# Patient Record
Sex: Female | Born: 1995 | Race: Black or African American | Hispanic: No | Marital: Single | Smoking: Never smoker
Health system: Southern US, Community
[De-identification: ages and names within clinical notes are randomized; demographics above are authoritative.]

## PROBLEM LIST (undated history)

## (undated) ENCOUNTER — Inpatient Hospital Stay: Payer: Self-pay

## (undated) DIAGNOSIS — F32A Depression, unspecified: Secondary | ICD-10-CM

## (undated) DIAGNOSIS — M329 Systemic lupus erythematosus, unspecified: Secondary | ICD-10-CM

## (undated) DIAGNOSIS — T7840XA Allergy, unspecified, initial encounter: Secondary | ICD-10-CM

## (undated) DIAGNOSIS — M3500 Sicca syndrome, unspecified: Secondary | ICD-10-CM

## (undated) DIAGNOSIS — N760 Acute vaginitis: Secondary | ICD-10-CM

## (undated) DIAGNOSIS — IMO0002 Reserved for concepts with insufficient information to code with codable children: Secondary | ICD-10-CM

## (undated) DIAGNOSIS — B9689 Other specified bacterial agents as the cause of diseases classified elsewhere: Secondary | ICD-10-CM

## (undated) DIAGNOSIS — R87629 Unspecified abnormal cytological findings in specimens from vagina: Secondary | ICD-10-CM

## (undated) HISTORY — DX: Sjogren syndrome, unspecified: M35.00

## (undated) HISTORY — DX: Allergy, unspecified, initial encounter: T78.40XA

## (undated) HISTORY — PX: THERAPEUTIC ABORTION: SHX798

## (undated) HISTORY — DX: Depression, unspecified: F32.A

## (undated) HISTORY — DX: Reserved for concepts with insufficient information to code with codable children: IMO0002

## (undated) HISTORY — DX: Systemic lupus erythematosus, unspecified: M32.9

---
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
## 2014-09-28 ENCOUNTER — Encounter (HOSPITAL_COMMUNITY): Payer: Self-pay | Admitting: Nurse Practitioner

## 2014-09-28 ENCOUNTER — Emergency Department (HOSPITAL_COMMUNITY)
Admission: EM | Admit: 2014-09-28 | Discharge: 2014-09-28 | Disposition: A | Discharge status: Home / Self Care | Payer: 59 | Attending: Emergency Medicine | Admitting: Emergency Medicine

## 2014-09-28 DIAGNOSIS — Z3202 Encounter for pregnancy test, result negative: Secondary | ICD-10-CM | POA: Diagnosis not present

## 2014-09-28 DIAGNOSIS — X58XXXA Exposure to other specified factors, initial encounter: Secondary | ICD-10-CM | POA: Diagnosis not present

## 2014-09-28 DIAGNOSIS — Y9302 Activity, running: Secondary | ICD-10-CM | POA: Insufficient documentation

## 2014-09-28 DIAGNOSIS — Z880 Allergy status to penicillin: Secondary | ICD-10-CM | POA: Diagnosis not present

## 2014-09-28 DIAGNOSIS — Y998 Other external cause status: Secondary | ICD-10-CM | POA: Diagnosis not present

## 2014-09-28 DIAGNOSIS — Y929 Unspecified place or not applicable: Secondary | ICD-10-CM | POA: Diagnosis not present

## 2014-09-28 DIAGNOSIS — S39012A Strain of muscle, fascia and tendon of lower back, initial encounter: Secondary | ICD-10-CM

## 2014-09-28 DIAGNOSIS — N939 Abnormal uterine and vaginal bleeding, unspecified: Secondary | ICD-10-CM | POA: Diagnosis present

## 2014-09-28 DIAGNOSIS — N921 Excessive and frequent menstruation with irregular cycle: Secondary | ICD-10-CM | POA: Insufficient documentation

## 2014-09-28 LAB — POC URINE PREG, ED: PREG TEST UR: NEGATIVE

## 2014-09-28 MED ORDER — IBUPROFEN 600 MG PO TABS: 600.0000 mg | ORAL_TABLET | Freq: Four times a day (QID) | ORAL | Status: DC | PRN

## 2014-09-28 NOTE — ED Provider Notes (Signed)
CSN: [id number]     Arrival date & time 09/28/14  1807 History   First MD Initiated Contact with Patient 09/28/14 1840     Chief Complaint  Patient presents with  . Leg Pain  . Vaginal Bleeding     (Consider location/radiation/quality/duration/timing/severity/associated sxs/prior Treatment) The history is provided by the patient.   patient presents with pain in her left lower back began while running. Radiates up the leg. It is dull. Is worse with movement. She is advancing as before. No numbness weakness. Feels as if she pulled a muscle. She also states she's had some vaginal spotting. States it is like menstrual blood. She states she is a week early for her menses. States she does not think she could be pregnant although she has had unprotected sex and is not using birth control. Has been on treatment for BV. Had recent STD check with the last couple weeks. Does not thinks that she needs STD check at this time. No other bleeding.   History reviewed. No pertinent past medical history. History reviewed. No pertinent past surgical history. History reviewed. No pertinent family history. History  Substance Use Topics  . Smoking status: Never Smoker   . Smokeless tobacco: Not on file  . Alcohol Use: No   OB History    No data available     Review of Systems  Constitutional: Negative for activity change and appetite change.  Eyes: Negative for pain.  Respiratory: Negative for chest tightness and shortness of breath.   Cardiovascular: Negative for chest pain and leg swelling.  Gastrointestinal: Negative for nausea, vomiting, abdominal pain and diarrhea.  Genitourinary: Positive for vaginal bleeding. Negative for flank pain.  Musculoskeletal: Positive for back pain. Negative for neck stiffness.  Skin: Negative for rash.  Neurological: Negative for weakness, numbness and headaches.  Psychiatric/Behavioral: Negative for behavioral problems.      Allergies  Penicillins  Home  Medications   Prior to Admission medications   Medication Sig Start Date End Date Taking? Authorizing Provider  ibuprofen (ADVIL,MOTRIN) 600 MG tablet Take 1 tablet (600 mg total) by mouth every 6 (six) hours as needed. 09/28/14   Benjiman CoreNathan Josph [place], MD   BP 108/73 mmHg  Pulse 70  Temp(Src) 98 F (36.7 C) (Oral)  Resp 16  Ht 5\' 3"  (1.6 m)  Wt 140 lb (63.504 kg)  BMI 24.81 kg/m2  SpO2 99%  LMP 09/04/2014 Physical Exam  Constitutional: She appears well-developed and well-nourished.  HENT:  Head: Normocephalic.  Eyes: EOM are normal.  Neck: Neck supple.  Cardiovascular: Normal rate.   Pulmonary/Chest: Effort normal.  Abdominal: Soft.  Musculoskeletal: She exhibits tenderness.  Some tenderness over SI joint on left. No pain with straight leg raise. Lumbar spine nontender.  Neurological: She is alert.  Skin: Skin is warm.    ED Course  Procedures (including critical care time) Labs Review Labs Reviewed  POC URINE PREG, ED    Imaging Review No results found.   EKG Interpretation None      MDM   Final diagnoses:  Lumbar strain, initial encounter  Metrorrhagia    Patient with back pain. 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 Also has slight vaginal bleeding but is not pregnant. Will discharge home.     Benjiman CoreNathan Dulcey Riederer, MD 09/29/14 [phone]

## 2014-09-28 NOTE — ED Notes (Signed)
Today she was running and felt a pulling sensation in her L buttock down L leg. She is also concerned because her menstrual cycle is 1 week early with some cramping.

## 2014-09-28 NOTE — Discharge Instructions (Signed)
Back Pain, Adult Low back pain is very common. About 1 in 5 people have back pain.The cause of low back pain is rarely dangerous. The pain often gets better over time.About half of people with a sudden onset of back pain feel better in just 2 weeks. About 8 in 10 people feel better by 6 weeks.  CAUSES Some common causes of back pain include:  Strain of the muscles or ligaments supporting the spine.  Wear and tear (degeneration) of the spinal discs.  Arthritis.  Direct injury to the back. DIAGNOSIS Most of the time, the direct cause of low back pain is not known.However, back pain can be treated effectively even when the exact cause of the pain is unknown.Answering your caregiver's questions about your overall health and symptoms is one of the most accurate ways to make sure the cause of your pain is not dangerous. If your caregiver needs more information, he or she may order lab work or imaging tests (X-rays or MRIs).However, even if imaging tests show changes in your back, this usually does not require surgery. HOME CARE INSTRUCTIONS For many people, back pain returns.Since low back pain is rarely dangerous, it is often a condition that people can learn to manageon their own.   Remain active. It is stressful on the back to sit or stand in one place. Do not sit, drive, or stand in one place for more than 30 minutes at a time. Take short walks on level surfaces as soon as pain allows.Try to increase the length of time you walk each day.  Do not stay in bed.Resting more than 1 or 2 days can delay your recovery.  Do not avoid exercise or work.Your body is made to move.It is not dangerous to be active, even though your back may hurt.Your back will likely heal faster if you return to being active before your pain is gone.  Pay attention to your body when you bend and lift. Many people have less discomfortwhen lifting if they bend their knees, keep the load close to their bodies,and  avoid twisting. Often, the most comfortable positions are those that put less stress on your recovering back.  Find a comfortable position to sleep. Use a firm mattress and lie on your side with your knees slightly bent. If you lie on your back, put a pillow under your knees.  Only take over-the-counter or prescription medicines as directed by your caregiver. Over-the-counter medicines to reduce pain and inflammation are often the most helpful.Your caregiver may prescribe muscle relaxant drugs.These medicines help dull your pain so you can more quickly return to your normal activities and healthy exercise.  Put ice on the injured area.  Put ice in a plastic bag.  Place a towel between your skin and the bag.  Leave the ice on for 15-20 minutes, 03-04 times a day for the first 2 to 3 days. After that, ice and heat may be alternated to reduce pain and spasms.  Ask your caregiver about trying back exercises and gentle massage. This may be of some benefit.  Avoid feeling anxious or stressed.Stress increases muscle tension and can worsen back pain.It is important to recognize when you are anxious or stressed and learn ways to manage it.Exercise is a great option. SEEK MEDICAL CARE IF:  You have pain that is not relieved with rest or medicine.  You have pain that does not improve in 1 week.  You have new symptoms.  You are generally not feeling well. SEEK   IMMEDIATE MEDICAL CARE IF:   You have pain that radiates from your back into your legs.  You develop new bowel or bladder control problems.  You have unusual weakness or numbness in your arms or legs.  You develop nausea or vomiting.  You develop abdominal pain.  You feel faint. Document Released: 05/02/2005 Document Revised: 11/01/2011 Document Reviewed: 09/03/2013 ExitCare Patient Information 2015 [employer]. This information is not intended to replace advice given to you by your health care provider. Make sure you  discuss any questions you have with your health care provider.  

## 2015-03-19 ENCOUNTER — Emergency Department (HOSPITAL_COMMUNITY)
Admission: EM | Admit: 2015-03-19 | Discharge: 2015-03-19 | Discharge status: Against Medical Advice | Payer: Medicaid Other | Attending: Emergency Medicine | Admitting: Emergency Medicine

## 2015-03-19 ENCOUNTER — Encounter (HOSPITAL_COMMUNITY): Payer: Self-pay | Admitting: *Deleted

## 2015-03-19 DIAGNOSIS — Z3A13 13 weeks gestation of pregnancy: Secondary | ICD-10-CM | POA: Insufficient documentation

## 2015-03-19 DIAGNOSIS — O9989 Other specified diseases and conditions complicating pregnancy, childbirth and the puerperium: Secondary | ICD-10-CM | POA: Diagnosis not present

## 2015-03-19 DIAGNOSIS — R1030 Lower abdominal pain, unspecified: Secondary | ICD-10-CM | POA: Insufficient documentation

## 2015-03-19 LAB — URINALYSIS, ROUTINE W REFLEX MICROSCOPIC
Bilirubin Urine: NEGATIVE
GLUCOSE, UA: NEGATIVE mg/dL
Hgb urine dipstick: NEGATIVE
Ketones, ur: 15 mg/dL — AB
LEUKOCYTES UA: NEGATIVE
Nitrite: NEGATIVE
PH: 6 (ref 5.0–8.0)
Protein, ur: NEGATIVE mg/dL
SPECIFIC GRAVITY, URINE: 1.031 — AB (ref 1.005–1.030)
Urobilinogen, UA: 0.2 mg/dL (ref 0.0–1.0)

## 2015-03-19 NOTE — ED Notes (Signed)
Pt called for room x3, no answer 

## 2015-03-19 NOTE — ED Notes (Signed)
Patient reports lower abdominal pain, constant in nature, since yesterday. Patient is [redacted] weeks pregnant. Reports no vaginal bleeding. States she just wants to make sure everything is okay.

## 2015-03-19 NOTE — ED Notes (Signed)
Pt called for room, no answer.

## 2015-04-20 ENCOUNTER — Other Ambulatory Visit: Payer: Self-pay | Admitting: Family Medicine

## 2015-04-20 DIAGNOSIS — Z3492 Encounter for supervision of normal pregnancy, unspecified, second trimester: Secondary | ICD-10-CM

## 2015-05-01 ENCOUNTER — Ambulatory Visit
Admission: RE | Admit: 2015-05-01 | Discharge: 2015-05-01 | Disposition: A | Discharge status: Home / Self Care | Payer: Medicaid Other | Source: Ambulatory Visit | Attending: Family Medicine | Admitting: Family Medicine

## 2015-05-01 DIAGNOSIS — Z36 Encounter for antenatal screening of mother: Secondary | ICD-10-CM | POA: Insufficient documentation

## 2015-05-01 DIAGNOSIS — Z3492 Encounter for supervision of normal pregnancy, unspecified, second trimester: Secondary | ICD-10-CM

## 2015-05-17 DIAGNOSIS — Z5189 Encounter for other specified aftercare: Secondary | ICD-10-CM

## 2015-05-17 HISTORY — DX: Encounter for other specified aftercare: Z51.89

## 2015-11-28 ENCOUNTER — Emergency Department
Admission: EM | Admit: 2015-11-28 | Discharge: 2015-11-29 | Disposition: A | Discharge status: Home / Self Care | Payer: Medicaid Other | Attending: Emergency Medicine | Admitting: Emergency Medicine

## 2015-11-28 ENCOUNTER — Emergency Department: Payer: Medicaid Other

## 2015-11-28 ENCOUNTER — Encounter: Payer: Self-pay | Admitting: Emergency Medicine

## 2015-11-28 DIAGNOSIS — Z3A17 17 weeks gestation of pregnancy: Secondary | ICD-10-CM | POA: Diagnosis not present

## 2015-11-28 DIAGNOSIS — O209 Hemorrhage in early pregnancy, unspecified: Secondary | ICD-10-CM | POA: Diagnosis present

## 2015-11-28 DIAGNOSIS — O4690 Antepartum hemorrhage, unspecified, unspecified trimester: Secondary | ICD-10-CM

## 2015-11-28 DIAGNOSIS — O4692 Antepartum hemorrhage, unspecified, second trimester: Secondary | ICD-10-CM

## 2015-11-28 DIAGNOSIS — O200 Threatened abortion: Secondary | ICD-10-CM | POA: Diagnosis not present

## 2015-11-28 LAB — CBC WITH DIFFERENTIAL/PLATELET
BASOS ABS: 0 10*3/uL (ref 0–0.1)
BASOS PCT: 0 %
Eosinophils Absolute: 0 10*3/uL (ref 0–0.7)
Eosinophils Relative: 0 %
HEMATOCRIT: 38 % (ref 35.0–47.0)
Hemoglobin: 13.2 g/dL (ref 12.0–16.0)
Lymphocytes Relative: 14 %
Lymphs Abs: 1 10*3/uL (ref 1.0–3.6)
MCH: 28.2 pg (ref 26.0–34.0)
MCHC: 34.7 g/dL (ref 32.0–36.0)
MCV: 81.2 fL (ref 80.0–100.0)
MONO ABS: 0.5 10*3/uL (ref 0.2–0.9)
Monocytes Relative: 7 %
NEUTROS ABS: 5.6 10*3/uL (ref 1.4–6.5)
NEUTROS PCT: 79 %
Platelets: 247 10*3/uL (ref 150–440)
RBC: 4.68 MIL/uL (ref 3.80–5.20)
RDW: 13.4 % (ref 11.5–14.5)
WBC: 7.1 10*3/uL (ref 3.6–11.0)

## 2015-11-28 LAB — COMPREHENSIVE METABOLIC PANEL
ALT: 11 U/L — ABNORMAL LOW (ref 14–54)
AST: 18 U/L (ref 15–41)
Albumin: 3.7 g/dL (ref 3.5–5.0)
Alkaline Phosphatase: 43 U/L (ref 38–126)
Anion gap: 6 (ref 5–15)
BILIRUBIN TOTAL: 0.3 mg/dL (ref 0.3–1.2)
BUN: 10 mg/dL (ref 6–20)
CHLORIDE: 107 mmol/L (ref 101–111)
CO2: 23 mmol/L (ref 22–32)
CREATININE: 0.64 mg/dL (ref 0.44–1.00)
Calcium: 8.8 mg/dL — ABNORMAL LOW (ref 8.9–10.3)
GFR calc Af Amer: >60 mL/min (ref >60)
GFR calc non Af Amer: >60 mL/min (ref >60)
GLUCOSE: 73 mg/dL (ref 65–99)
POTASSIUM: 3.6 mmol/L (ref 3.5–5.1)
Sodium: 136 mmol/L (ref 135–145)
TOTAL PROTEIN: 7.1 g/dL (ref 6.5–8.1)

## 2015-11-28 LAB — ABO/RH: ABO/RH(D): O POS

## 2015-11-28 LAB — URINALYSIS COMPLETE WITH MICROSCOPIC (ARMC ONLY)
BILIRUBIN URINE: NEGATIVE
Bacteria, UA: NONE SEEN
Glucose, UA: NEGATIVE mg/dL
Hgb urine dipstick: NEGATIVE
Leukocytes, UA: NEGATIVE
Nitrite: NEGATIVE
PH: 6 (ref 5.0–8.0)
Protein, ur: NEGATIVE mg/dL
RBC / HPF: NONE SEEN RBC/hpf (ref 0–5)
Specific Gravity, Urine: 1.018 (ref 1.005–1.030)

## 2015-11-28 LAB — HCG, QUANTITATIVE, PREGNANCY: hCG, Beta Chain, Quant, S: 42014 m[IU]/mL — ABNORMAL HIGH (ref <5)

## 2015-11-28 NOTE — ED Notes (Signed)
[redacted] weeks pregnant, episode of bleeding in toilet today. Abdominal cramping.

## 2015-11-28 NOTE — ED Notes (Signed)
MD at bedside. 

## 2015-11-28 NOTE — Discharge Instructions (Signed)
Vaginal Bleeding During Pregnancy, Second Trimester A small amount of bleeding (spotting) from the vagina is relatively common in pregnancy. It usually stops on its own. Various things can cause bleeding or spotting in pregnancy. Some bleeding may be related to the pregnancy, and some may not. Sometimes the bleeding is normal and is not a problem. However, bleeding can also be a sign of something serious. Be sure to tell your health care provider about any vaginal bleeding right away. Some possible causes of vaginal bleeding during the second trimester include:  Infection, inflammation, or growths on the cervix.   The placenta may be partially or completely covering the opening of the cervix inside the uterus (placenta previa).  The placenta may have separated from the uterus (abruption of the placenta).   You may be having early (preterm) labor.   The cervix may not be strong enough to keep a baby inside the uterus (cervical insufficiency).   Tiny cysts may have developed in the uterus instead of pregnancy tissue (molar pregnancy). HOME CARE INSTRUCTIONS  Watch your condition for any changes. The following actions may help to lessen any discomfort you are feeling:  Follow your health care provider's instructions for limiting your activity. If your health care provider orders bed rest, you may need to stay in bed and only get up to use the bathroom. However, your health care provider may allow you to continue light activity.  If needed, make plans for someone to help with your regular activities and responsibilities while you are on bed rest.  Keep track of the number of pads you use each day, how often you change pads, and how soaked (saturated) they are. Write this down.  Do not use tampons. Do not douche.  Do not have sexual intercourse or orgasms until approved by your health care provider.  If you pass any tissue from your vagina, save the tissue so you can show it to your  health care provider.  Only take over-the-counter or prescription medicines as directed by your health care provider.  Do not take aspirin because it can make you bleed.  Do not exercise or perform any strenuous activities or heavy lifting without your health care provider's permission.  Keep all follow-up appointments as directed by your health care provider. SEEK MEDICAL CARE IF:  You have any vaginal bleeding during any part of your pregnancy.  You have cramps or labor pains.  You have a fever, not controlled by medicine. SEEK IMMEDIATE MEDICAL CARE IF:   You have severe cramps in your back or belly (abdomen).  You have contractions.  You have chills.  You pass large clots or tissue from your vagina.  Your bleeding increases.  You feel light-headed or weak, or you have fainting episodes.  You are leaking fluid or have a gush of fluid from your vagina. MAKE SURE YOU:  Understand these instructions.  Will watch your condition.  Will get help right away if you are not doing well or get worse.   This information is not intended to replace advice given to you by your health care provider. Make sure you discuss any questions you have with your health care provider.   Document Released: 02/09/2005 Document Revised: 05/07/2013 Document Reviewed: 01/07/2013 Elsevier Interactive Patient Education 2016 [employer]  Threatened Miscarriage A threatened miscarriage occurs when you have vaginal bleeding during your first 20 weeks of pregnancy but the pregnancy has not ended. If you have vaginal bleeding during this time, your health care  provider will do tests to make sure you are still pregnant. If the tests show you are still pregnant and the developing baby (fetus) inside your womb (uterus) is still growing, your condition is considered a threatened miscarriage. A threatened miscarriage does not mean your pregnancy will end, but it does increase the risk of losing your  pregnancy (complete miscarriage). CAUSES  The cause of a threatened miscarriage is usually not known. If you go on to have a complete miscarriage, the most common cause is an abnormal number of chromosomes in the developing baby. Chromosomes are the structures inside cells that hold all your genetic material. Some causes of vaginal bleeding that do not result in miscarriage include:  Having sex.  Having an infection.  Normal hormone changes of pregnancy.  Bleeding that occurs when an egg implants in your uterus. RISK FACTORS Risk factors for bleeding in early pregnancy include:  Obesity.  Smoking.  Drinking excessive amounts of alcohol or caffeine.  Recreational drug use. SIGNS AND SYMPTOMS  Light vaginal bleeding.  Mild abdominal pain or cramps. DIAGNOSIS  If you have bleeding with or without abdominal pain before 20 weeks of pregnancy, your health care provider will do tests to check whether you are still pregnant. One important test involves using sound waves and a computer (ultrasound) to create images of the inside of your uterus. Other tests include an internal exam of your vagina and uterus (pelvic exam) and measurement of your baby's heart rate.  You may be diagnosed with a threatened miscarriage if:  Ultrasound testing shows you are still pregnant.  Your baby's heart rate is strong.  A pelvic exam shows that the opening between your uterus and your vagina (cervix) is closed.  Your heart rate and blood pressure are stable.  Blood tests confirm you are still pregnant. TREATMENT  No treatments have been shown to prevent a threatened miscarriage from going on to a complete miscarriage. However, the right home care is important.  HOME CARE INSTRUCTIONS   Make sure you keep all your appointments for prenatal care. This is very important.  Get plenty of rest.  Do not have sex or use tampons if you have vaginal bleeding.  Do not douche.  Do not smoke or use  recreational drugs.  Do not drink alcohol.  Avoid caffeine. SEEK MEDICAL CARE IF:  You have light vaginal bleeding or spotting while pregnant.  You have abdominal pain or cramping.  You have a fever. SEEK IMMEDIATE MEDICAL CARE IF:  You have heavy vaginal bleeding.  You have blood clots coming from your vagina.  You have severe low back pain or abdominal cramps.  You have fever, chills, and severe abdominal pain. MAKE SURE YOU:  Understand these instructions.  Will watch your condition.  Will get help right away if you are not doing well or get worse.   This information is not intended to replace advice given to you by your health care provider. Make sure you discuss any questions you have with your health care provider.   Document Released: 05/02/2005 Document Revised: 05/07/2013 Document Reviewed: 02/26/2013 Elsevier Interactive Patient Education [employer]

## 2015-11-28 NOTE — ED Provider Notes (Signed)
[facility] Emergency Department Provider Note  ____________________________________________  Time seen: 11:30 PM  I have reviewed the triage vital signs and the nursing notes.   HISTORY  Chief Complaint Vaginal Bleeding     HPI Yvonne Christensen is a 20 y.o. female G2 para 01 previous elective abortion presents with history of being proximal to [redacted] weeks pregnant followed by [facility]. Patient admits to acute onset of abdominal cramping and one episode of vaginal bleeding which was noted today the patient states that last sexual intercourse was approximately one to 2 weeks ago. Patient states her blood type is O+.   Past medical history None There are no active problems to display for this patient.   Past surgical history  1 elective abortion.  Current Outpatient Rx  Name  Route  Sig  Dispense  Refill  . ibuprofen (ADVIL,MOTRIN) 600 MG tablet   Oral   Take 1 tablet (600 mg total) by mouth every 6 (six) hours as needed.   10 tablet   0     Allergies Penicillins  No family history on file.  Social History Social History  Substance Use Topics  . Smoking status: Never Smoker   . Smokeless tobacco: None  . Alcohol Use: No    Review of Systems  Constitutional: Negative for fever. Eyes: Negative for visual changes. ENT: Negative for sore throat. Cardiovascular: Negative for chest pain. Respiratory: Negative for shortness of breath. Gastrointestinal: Negative for abdominal pain, vomiting and diarrhea. Genitourinary: Negative for dysuria.Positive vaginal bleeding Musculoskeletal: Negative for back pain. Skin: Negative for rash. Neurological: Negative for headaches, focal weakness or numbness.   10-point ROS otherwise negative.  ____________________________________________   PHYSICAL EXAM:  VITAL SIGNS: ED Triage Vitals  Enc Vitals Group     BP 11/28/15 1718 104/60 mmHg     Pulse Rate 11/28/15 1718 81     Resp 11/28/15 1718 18      Temp 11/28/15 1718 98.3 F (36.8 C)     Temp Source 11/28/15 1718 Oral     SpO2 11/28/15 1718 100 %     Weight 11/28/15 1718 137 lb (62.143 kg)     Height 11/28/15 1718  (1.6 m)     Head Cir --      Peak Flow --      Pain Score 11/28/15 1720 5     Pain Loc --      Pain Edu? --      Excl. in GC? --      Constitutional: Alert and oriented. Well appearing and in no distress. Eyes: Conjunctivae are normal. PERRL. Normal extraocular movements. ENT   Head: Normocephalic and atraumatic.   Nose: No congestion/rhinnorhea.   Mouth/Throat: Mucous membranes are moist.   Neck: No stridor. Hematological/Lymphatic/Immunilogical: No cervical lymphadenopathy. Cardiovascular: Normal rate, regular rhythm. Normal and symmetric distal pulses are present in all extremities. No murmurs, rubs, or gallops. Respiratory: Normal respiratory effort without tachypnea nor retractions. Breath sounds are clear and equal bilaterally. No wheezes/rales/rhonchi. Gastrointestinal: Soft and nontender. No distention. There is no CVA tenderness. Genitourinary: No active vaginal bleeding noted on exam. Musculoskeletal: Nontender with normal range of motion in all extremities. No joint effusions.  No lower extremity tenderness nor edema. Neurologic:  Normal speech and language. No gross focal neurologic deficits are appreciated. Speech is normal.  Skin:  Skin is warm, dry and intact. No rash noted. Psychiatric: Mood and affect are normal. Speech and behavior are normal. Patient exhibits appropriate insight and  judgment.  ____________________________________________    LABS (pertinent positives/negatives)  Labs Reviewed  HCG, QUANTITATIVE, PREGNANCY - Abnormal; Notable for the following:    hCG, Beta Chain, Quant, S 42014 (*)    All other components within normal limits  COMPREHENSIVE METABOLIC PANEL - Abnormal; Notable for the following:    Calcium 8.8 (*)    ALT 11 (*)    All other components  within normal limits  URINALYSIS COMPLETEWITH MICROSCOPIC ([facility] ONLY) - Abnormal; Notable for the following:    Color, Urine YELLOW (*)    APPearance CLEAR (*)    Ketones, ur 1+ (*)    Squamous Epithelial / LPF 0-5 (*)    All other components within normal limits  CBC WITH DIFFERENTIAL/PLATELET  ABO/RH      RADIOLOGY  US OB Limited (Final result) Result time: 11/28/15 21:17:17   Procedure changed from US Ob Comp + 14 Wk      Final result by Rad Results In Interface (11/28/15 21:17:17)   Narrative:   CLINICAL DATA: Vaginal bleeding this afternoon. Estimated gestational age by LMP is 16 weeks 6 days.  EXAM: LIMITED OBSTETRIC ULTRASOUND  FINDINGS: Number of Fetuses: Single  Heart Rate: 141 bpm  Movement: Fetal movement is observed.  Presentation: Cephalic presentation.  Placental Location: Anterior  Previa: None.  Amniotic Fluid (Subjective): Within normal limits.  BPD: 3.9cm 17w 5d  MATERNAL FINDINGS:  Cervix: Cervix is closed. Cervical length measures 3.3 cm.  Uterus/Adnexae: Limited visualization. Ovaries not visualized.  IMPRESSION: Single intrauterine pregnancy with fetus in cephalic presentation. No acute complications demonstrated on limited imaging.  This exam is performed on an emergent basis and does not comprehensively evaluate fetal size, dating, or anatomy; follow-up complete OB US should be considered if further fetal assessment is warranted.   Electronically Signed By: Burman NievesWilliam Stevens M.D. On: 11/28/2015 21:17     Procedures      INITIAL IMPRESSION / ASSESSMENT AND PLAN / ED COURSE  Pertinent labs & imaging results that were available during my care of the patient were reviewed by me and considered in my medical decision making (see chart for details).    ____________________________________________   FINAL CLINICAL IMPRESSION(S) / ED DIAGNOSES  Final diagnoses:  Vaginal bleeding in pregnancy       Darci Currentandolph N Wendy Hoback, MD 11/29/15 [phone]

## 2015-11-29 LAB — CHLAMYDIA/NGC RT PCR (ARMC ONLY)
CHLAMYDIA TR: DETECTED — AB
N gonorrhoeae: NOT DETECTED

## 2015-11-29 LAB — WET PREP, GENITAL
Clue Cells Wet Prep HPF POC: NONE SEEN
Sperm: NONE SEEN
Trich, Wet Prep: NONE SEEN
YEAST WET PREP: NONE SEEN

## 2015-11-30 ENCOUNTER — Telehealth: Payer: Self-pay | Admitting: Emergency Medicine

## 2015-11-30 NOTE — Telephone Encounter (Signed)
Called patient and informed her of positive chlamydia test and need for treatment.  Per dr Cyril Loosenkinner call in azithromycin 1 gram po x 1 dose to pt preferred pharmacy.  Called to [facility] [place].  She has arranged follow up with [facility] on Wednesday.

## 2016-01-01 DIAGNOSIS — Z8619 Personal history of other infectious and parasitic diseases: Secondary | ICD-10-CM | POA: Insufficient documentation

## 2016-03-16 DIAGNOSIS — B9689 Other specified bacterial agents as the cause of diseases classified elsewhere: Secondary | ICD-10-CM

## 2016-03-16 HISTORY — DX: Other specified bacterial agents as the cause of diseases classified elsewhere: B96.89

## 2016-03-25 ENCOUNTER — Observation Stay
Admission: EM | Admit: 2016-03-25 | Discharge: 2016-03-25 | Disposition: A | Discharge status: Home / Self Care | Payer: Medicaid Other | Attending: Obstetrics & Gynecology | Admitting: Obstetrics & Gynecology

## 2016-03-25 ENCOUNTER — Encounter: Payer: Self-pay | Admitting: *Deleted

## 2016-03-25 DIAGNOSIS — R32 Unspecified urinary incontinence: Secondary | ICD-10-CM | POA: Diagnosis not present

## 2016-03-25 DIAGNOSIS — O26893 Other specified pregnancy related conditions, third trimester: Secondary | ICD-10-CM | POA: Diagnosis not present

## 2016-03-25 DIAGNOSIS — O42913 Preterm premature rupture of membranes, unspecified as to length of time between rupture and onset of labor, third trimester: Secondary | ICD-10-CM | POA: Diagnosis present

## 2016-03-25 DIAGNOSIS — O4290 Premature rupture of membranes, unspecified as to length of time between rupture and onset of labor, unspecified weeks of gestation: Secondary | ICD-10-CM | POA: Diagnosis present

## 2016-03-25 DIAGNOSIS — Z3A33 33 weeks gestation of pregnancy: Secondary | ICD-10-CM | POA: Insufficient documentation

## 2016-03-25 HISTORY — DX: Other specified bacterial agents as the cause of diseases classified elsewhere: B96.89

## 2016-03-25 HISTORY — DX: Premature rupture of membranes, unspecified as to length of time between rupture and onset of labor, unspecified weeks of gestation: O42.90

## 2016-03-25 HISTORY — DX: Acute vaginitis: N76.0

## 2016-03-25 MED ORDER — ACETAMINOPHEN 325 MG PO TABS: 650.0000 mg | ORAL_TABLET | ORAL | Status: DC | PRN

## 2016-03-25 MED ORDER — ONDANSETRON HCL 4 MG/2ML IJ SOLN: 4.0000 mg | Freq: Four times a day (QID) | INTRAMUSCULAR | Status: DC | PRN

## 2016-03-25 NOTE — Discharge Summary (Signed)
Physician Discharge Summary  Patient ID: [name] MRN: [id number] DOB/AGE: March 30, 1996 20 y.o.  Admit date: 03/25/2016 Discharge date: 03/25/2016  Admission Diagnoses: Leakage of vaginal fluid  Discharge Diagnoses: Urinary Incontinence  Discharged Condition: good  Hospital Course: Seen and examined, see H&P.  No s/sx PPTOM or PTL.  [facility]  Consults: None  Significant Diagnostic Studies: A NST procedure was performed with FHR monitoring and a normal baseline established, appropriate time of 20-40 minutes of evaluation, and accels >2 seen w 15x15 characteristics.  Results show a REACTIVE NST.   Treatments: None  Discharge Exam: Height (P) 5\' 3"  (1.6 m), weight (P) 170 lb (77.1 kg), last menstrual period 08/02/2015. General appearance: alert, cooperative and no distress NST R; Rare ctx  Disposition: 01-Home or Self Care     Medication List    TAKE these medications   ibuprofen 600 MG tablet Commonly known as:  ADVIL,MOTRIN Take 1 tablet (600 mg total) by mouth every 6 (six) hours as needed.   multivitamin tablet Take 1 tablet by mouth daily. Prenatal vitamin        Signed: Letitia LibraRobert Paul Harris 03/25/2016, 12:09 PM

## 2016-03-25 NOTE — Discharge Instructions (Signed)
[facility]

## 2016-03-25 NOTE — H&P (Signed)
Obstetrics Admission History & Physical   CC:  vag fluid loss x2 with sneezes- reports odorless, clear small amount fluid   HPI:  20 y.o. G2P0010 @ 5821w5d (05/08/2016, Date entered prior to episode creation). Admitted on 03/25/2016:   Presents for leakage of vaginal fluid over last two days intermittantly, worse w sneeze or cough, no ctxs, no pain, no VB.  Prenatal care at: at another place  PMHx:  Past Medical History:  Diagnosis Date  . Bacterial vaginosis 03/2016   being treated, dosnot remember med name   PSHx:  Past Surgical History:  Procedure Laterality Date  . WISDOM TOOTH EXTRACTION  2013   g/a   Medications:  Prescriptions Prior to Admission  Medication Sig Dispense Refill Last Dose  . Multiple Vitamin (MULTIVITAMIN) tablet Take 1 tablet by mouth daily. Prenatal vitamin   0730  . ibuprofen (ADVIL,MOTRIN) 600 MG tablet Take 1 tablet (600 mg total) by mouth every 6 (six) hours as needed. (Patient not taking: Reported on 03/25/2016) 10 tablet 0 Not Taking at Unknown time   Allergies: is allergic to penicillins. OBHx:  OB History  Gravida Para Term Preterm AB Living  2       1    SAB TAB Ectopic Multiple Live Births               # Outcome Date GA Lbr Len/2nd Weight Sex Delivery Anes PTL Lv  2 Current           1 AB              AVW:[id number]/[id number]:Negative/unremarkable except as detailed in HPI. Soc Hx: Never smoker, Alcohol: none and Recreational drug use: none  Objective:  There were no vitals filed for this visit. General: Well nourished, well developed female in no acute distress.  Skin: Warm and dry.  Cardiovascular:Regular rate and rhythm. Respiratory: Clear to auscultation bilateral. Normal respiratory effort Abdomen: mild Neuro/Psych: Normal mood and affect.   Pelvic exam: is not limited by body habitus EGBUS: within normal limits Vagina: within normal limits and with normal mucosa blood in the vault Cervix: cl/th Uterus: Uterus demonstrates irritability  pattern.  Adnexa: not evaluated  EFM:FHR: 150 bpm, variability: minimal ,  accelerations:  Present,  decelerations:  Absent Toco: Frequency: Every 10 minutes  Fern Neg, Nitrazine ambiguous, Pooling neg  Assessment & Plan:   20 y.o. G2P0010 @ 921w5d, Admitted on 03/25/2016: Concern over vaginal leakage of fluid.  Urinary Incontinence. No signs of PPROM or PTL. Counseled likely Urinary Incontionence.

## 2016-04-21 ENCOUNTER — Observation Stay
Admission: EM | Admit: 2016-04-21 | Discharge: 2016-04-21 | Disposition: A | Discharge status: Home / Self Care | Payer: Medicaid Other | Attending: Obstetrics & Gynecology | Admitting: Obstetrics & Gynecology

## 2016-04-21 DIAGNOSIS — R109 Unspecified abdominal pain: Secondary | ICD-10-CM | POA: Insufficient documentation

## 2016-04-21 DIAGNOSIS — M545 Low back pain: Secondary | ICD-10-CM | POA: Diagnosis not present

## 2016-04-21 DIAGNOSIS — O368130 Decreased fetal movements, third trimester, not applicable or unspecified: Secondary | ICD-10-CM | POA: Diagnosis not present

## 2016-04-21 DIAGNOSIS — O26893 Other specified pregnancy related conditions, third trimester: Secondary | ICD-10-CM | POA: Diagnosis not present

## 2016-04-21 DIAGNOSIS — Z3A37 37 weeks gestation of pregnancy: Secondary | ICD-10-CM | POA: Diagnosis not present

## 2016-04-21 NOTE — Discharge Summary (Signed)
Yvonne Christensen is a 20 y.o. female. She is at 316w4d gestation.  Chief Complaint: abdominal pain, low back pain  S: Resting comfortably. no CTX, no VB.no LOF,  Active fetal movement.   Location: low back, upper abdomen Context: c/o abdominal pain and lower back pain- started yesterday at approx. 5 PM, intermittent. Denies bleeding, LOF, reports decreased fetal movement. Also reports that her hands feel numb occasionally. Last office visit 1 week ago, [facility], ([facility]), lives in [place] Quality: crampy Severity: mild Aggravating factors: none Alleviating factors: none Associated signs/symptoms: denies nausea, vomiting, diarrhea   Maternal Medical History:   Past Medical History:  Diagnosis Date  . Bacterial vaginosis 03/2016   being treated, dosnot remember med name    Past Surgical History:  Procedure Laterality Date  . NO PAST SURGERIES    . WISDOM TOOTH EXTRACTION  2013   g/a    Allergies  Allergen Reactions  . Penicillins     Prior to Admission medications   Medication Sig Start Date End Date Taking? Authorizing Provider  Multiple Vitamin (MULTIVITAMIN) tablet Take 1 tablet by mouth daily. Prenatal vitamin   Yes Historical Provider, MD     [facility]  Social History: She  reports that she has never smoked. She has never used smokeless tobacco. She reports that she does not drink alcohol or use drugs.  Family History: family history includes Cancer in her maternal grandfather; Hypertension in her maternal grandmother.   Review of Systems: A full review of systems was performed and negative except as noted in the HPI.     O:  BP 113/61 (BP Location: Right Arm)   Pulse 91   Temp 98.1 F (36.7 C) (Oral)   Resp 16   LMP 08/02/2015  No results found for this or any previous visit (from the past 48 hour(s)).   Constitutional: NAD, AAOx3  HE/ENT: extraocular movements grossly intact, moist mucous membranes CV: RRR PULM: nl  respiratory effort, CTABL     Abd: gravid, non-tender, non-distended, soft      Ext: Non-tender, Nonedmeatous   Psych: mood appropriate, speech normal Pelvic:  FHT: 135 mod + accels no decels TOCO: occasional    A/P:  20yo G2P0010 @ 37+4 with rule out labor.   Labor: not present.   Fetal Wellbeing: Reassuring Cat 1 tracing.  D/c home stable, precautions reviewed, follow-up as scheduled.   ----- Ranae Plumberhelsea Fuller Makin, MD Attending Obstetrician and Gynecologist [facility], Department of OB/GYN [facility]

## 2016-04-21 NOTE — OB Triage Note (Signed)
Yvonne Christensen here with c/o abdominal pain and lower back pain- started yesterday at approx. 5 PM, intermittent. Denies bleeding, LOF, reports decreased fetal movement. Also reports that her hands feel numb occasionally. Last office visit 1 week ago, [facility], ([facility]), lives in [place].

## 2016-04-23 ENCOUNTER — Inpatient Hospital Stay
Admission: EM | Admit: 2016-04-23 | Discharge: 2016-04-23 | Disposition: A | Discharge status: Home / Self Care | Payer: Medicaid Other | Attending: Obstetrics & Gynecology | Admitting: Obstetrics & Gynecology

## 2016-04-23 DIAGNOSIS — O368190 Decreased fetal movements, unspecified trimester, not applicable or unspecified: Secondary | ICD-10-CM

## 2016-04-23 DIAGNOSIS — O368130 Decreased fetal movements, third trimester, not applicable or unspecified: Secondary | ICD-10-CM | POA: Insufficient documentation

## 2016-04-23 DIAGNOSIS — Z3A37 37 weeks gestation of pregnancy: Secondary | ICD-10-CM | POA: Diagnosis not present

## 2016-04-23 DIAGNOSIS — O23593 Infection of other part of genital tract in pregnancy, third trimester: Secondary | ICD-10-CM | POA: Diagnosis not present

## 2016-04-23 HISTORY — DX: Decreased fetal movements, unspecified trimester, not applicable or unspecified: O36.8190

## 2016-04-23 NOTE — OB Triage Note (Signed)
Decreased fetal movement. Last felt baby move @ 1000 this am. [name], [name]

## 2016-04-23 NOTE — H&P (Signed)
Yvonne Christensen is a 20 y.o. female presenting for decreased fetal movement today since 10 am. Pt was seen here on Thursday and [facility] FM. Pt is concerned and just wanted to get checked.  OB History    Gravida Para Term Preterm AB Living   2       1     SAB TAB Ectopic Multiple Live Births                 Past Medical History:  Diagnosis Date  . Bacterial vaginosis 03/2016   being treated, dosnot remember med name   Past Surgical History:  Procedure Laterality Date  . NO PAST SURGERIES    . WISDOM TOOTH EXTRACTION  2013   g/a   Family History: family history includes Cancer in her maternal grandfather; Hypertension in her maternal grandmother. Social History:  reports that she has never smoked. She has never used smokeless tobacco. She reports that she does not drink alcohol or use drugs.     Maternal Diabetes: No, 1 h GCT WNL Genetic Screening: None found in records Maternal Ultrasounds/Referrals: Normal Fetal Ultrasounds or other Referrals:  Other:  Maternal Substance Abuse:  No Significant Maternal Medications:  None Significant Maternal Lab Results:  None Other Comments:    Review of Systems  Constitutional: Negative.   HENT: Negative.   Eyes: Negative.   Respiratory: Negative.   Cardiovascular: Negative.   Gastrointestinal: Negative.   Genitourinary: Negative.   Musculoskeletal: Negative.   Skin: Negative.   Neurological: Negative.   Endo/Heme/Allergies: Negative.   Psychiatric/Behavioral: Negative.    History   Blood pressure 122/76, pulse (!) 102, temperature 98.1 F (36.7 C), temperature source Oral, resp. rate 18, last menstrual period 08/02/2015. Exam Physical Exam  Prenatal labs: ABO, Rh: --/--/O POS (07/15 1724) Antibody:  not found Rubella:  not found RPR:   NT HBsAg:    HIV:   NR GBS:   Neg GCT: 1 h: 83 Assessment/Plan: A: IUP at term with reactive NST 2. Reassure pt re: FM. 3.  Monitor Fetal kick coutns   Yvonne Christensen 04/23/2016, 6:30 PM

## 2016-04-23 NOTE — Discharge Summary (Signed)
Obstetric Discharge Summary Reason for Admission: decreased fetal movement Prenatal Procedures: NST Intrapartum Procedures: none Postpartum Procedures: not applicable yet Complications-Operative and Postpartum: none Hemoglobin  Date Value Ref Range Status  11/28/2015 13.2 12.0 - 16.0 g/dL Final   HGB  Date Value Ref Range Status  11/02/2011 12.7 12.0 - 16.0 g/dL Final   HCT  Date Value Ref Range Status  11/28/2015 38.0 35.0 - 47.0 % Final    Physical Exam:  General:A&O x3 Resp: reg and non-labored  Discharge Diagnoses: IUP at 37 weeks with decreased fm  Discharge Information: Date: 04/23/2016 Activity: pelvic rest Diet: routine Medications: PNV Condition: stable  Instructions:Home to rest. FKC's daily Discharge to: home   Newborn Data: This patient has no babies on file.  [name] 04/23/2016, 6:50 PM

## 2016-04-23 NOTE — Discharge Instructions (Signed)
Introduction Patient Name: ________________________________________________ Patient Due Date: ____________________ What is a fetal movement count? A fetal movement count is the number of times that you feel your baby move during a certain amount of time. This may also be called a fetal kick count. A fetal movement count is recommended for every pregnant woman. You may be asked to start counting fetal movements as early as week 28 of your pregnancy. Pay attention to when your baby is most active. You may notice your baby's sleep and wake cycles. You may also notice things that make your baby move more. You should do a fetal movement count:  When your baby is normally most active.  At the same time each day. A good time to count movements is while you are resting, after having something to eat and drink. How do I count fetal movements? 1. Find a quiet, comfortable area. Sit, or lie down on your side. 2. Write down the date, the start time and stop time, and the number of movements that you felt between those two times. Take this information with you to your health care visits. 3. For 2 hours, count kicks, flutters, swishes, rolls, and jabs. You should feel at least 10 movements during 2 hours. 4. You may stop counting after you have felt 10 movements. 5. If you do not feel 10 movements in 2 hours, have something to eat and drink. Then, keep resting and counting for 1 hour. If you feel at least 4 movements during that hour, you may stop counting. Contact a health care provider if:  You feel fewer than 4 movements in 2 hours.  Your baby is not moving like he or she usually does. Date: ____________ Start time: ____________ Stop time: ____________ Movements: ____________ Date: ____________ Start time: ____________ Stop time: ____________ Movements: ____________ Date: ____________ Start time: ____________ Stop time: ____________ Movements: ____________ Date: ____________ Start time: ____________  Stop time: ____________ Movements: ____________ Date: ____________ Start time: ____________ Stop time: ____________ Movements: ____________ Date: ____________ Start time: ____________ Stop time: ____________ Movements: ____________ Date: ____________ Start time: ____________ Stop time: ____________ Movements: ____________ Date: ____________ Start time: ____________ Stop time: ____________ Movements: ____________ Date: ____________ Start time: ____________ Stop time: ____________ Movements: ____________ This information is not intended to replace advice given to you by your health care provider. Make sure you discuss any questions you have with your health care provider. Document Released: 06/01/2006 Document Revised: 12/30/2015 Document Reviewed: 06/11/2015 Elsevier Interactive Patient Education  2017 [employer]  

## 2016-10-22 ENCOUNTER — Encounter: Payer: Self-pay | Admitting: Emergency Medicine

## 2016-10-22 ENCOUNTER — Emergency Department
Admission: EM | Admit: 2016-10-22 | Discharge: 2016-10-22 | Disposition: A | Discharge status: Home / Self Care | Attending: Emergency Medicine | Admitting: Emergency Medicine

## 2016-10-22 DIAGNOSIS — S3992XA Unspecified injury of lower back, initial encounter: Secondary | ICD-10-CM | POA: Diagnosis present

## 2016-10-22 DIAGNOSIS — S39012A Strain of muscle, fascia and tendon of lower back, initial encounter: Secondary | ICD-10-CM | POA: Diagnosis not present

## 2016-10-22 DIAGNOSIS — Y998 Other external cause status: Secondary | ICD-10-CM | POA: Diagnosis not present

## 2016-10-22 DIAGNOSIS — Y939 Activity, unspecified: Secondary | ICD-10-CM | POA: Diagnosis not present

## 2016-10-22 DIAGNOSIS — Y92410 Unspecified street and highway as the place of occurrence of the external cause: Secondary | ICD-10-CM | POA: Diagnosis not present

## 2016-10-22 MED ORDER — CYCLOBENZAPRINE HCL 5 MG PO TABS: 5.0000 mg | ORAL_TABLET | Freq: Three times a day (TID) | ORAL | 0 refills | Status: DC | PRN
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
## 2016-10-22 NOTE — Discharge Instructions (Signed)
Your exam is consistent with muscle strain. Take the muscle relaxant along with OTC ibuprofen. Follow-up with your provider as needed.
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
## 2016-11-25 ENCOUNTER — Emergency Department

## 2016-11-25 ENCOUNTER — Emergency Department
Admission: EM | Admit: 2016-11-25 | Discharge: 2016-11-25 | Disposition: A | Discharge status: Home / Self Care | Attending: Emergency Medicine | Admitting: Emergency Medicine

## 2016-11-25 ENCOUNTER — Encounter: Payer: Self-pay | Admitting: Emergency Medicine

## 2016-11-25 DIAGNOSIS — D72820 Lymphocytosis (symptomatic): Secondary | ICD-10-CM | POA: Diagnosis not present

## 2016-11-25 DIAGNOSIS — E860 Dehydration: Secondary | ICD-10-CM | POA: Diagnosis present

## 2016-11-25 DIAGNOSIS — N760 Acute vaginitis: Secondary | ICD-10-CM | POA: Diagnosis not present

## 2016-11-25 DIAGNOSIS — N739 Female pelvic inflammatory disease, unspecified: Secondary | ICD-10-CM | POA: Diagnosis not present

## 2016-11-25 DIAGNOSIS — B9689 Other specified bacterial agents as the cause of diseases classified elsewhere: Secondary | ICD-10-CM

## 2016-11-25 LAB — COMPREHENSIVE METABOLIC PANEL
ALBUMIN: 4.2 g/dL (ref 3.5–5.0)
ALT: 16 U/L (ref 14–54)
ANION GAP: 9 (ref 5–15)
AST: 24 U/L (ref 15–41)
Alkaline Phosphatase: 49 U/L (ref 38–126)
BUN: 13 mg/dL (ref 6–20)
CO2: 21 mmol/L — AB (ref 22–32)
Calcium: 9.4 mg/dL (ref 8.9–10.3)
Chloride: 105 mmol/L (ref 101–111)
Creatinine, Ser: 0.99 mg/dL (ref 0.44–1.00)
GFR calc Af Amer: >60 mL/min (ref >60)
GFR calc non Af Amer: >60 mL/min (ref >60)
GLUCOSE: 113 mg/dL — AB (ref 65–99)
Potassium: 3.1 mmol/L — ABNORMAL LOW (ref 3.5–5.1)
SODIUM: 135 mmol/L (ref 135–145)
Total Bilirubin: 0.9 mg/dL (ref 0.3–1.2)
Total Protein: 7.8 g/dL (ref 6.5–8.1)

## 2016-11-25 LAB — URINALYSIS, COMPLETE (UACMP) WITH MICROSCOPIC
Bacteria, UA: NONE SEEN
Bilirubin Urine: NEGATIVE
Glucose, UA: NEGATIVE mg/dL
Hgb urine dipstick: NEGATIVE
Ketones, ur: NEGATIVE mg/dL
Nitrite: NEGATIVE
PROTEIN: 30 mg/dL — AB
Specific Gravity, Urine: 1.032 — ABNORMAL HIGH (ref 1.005–1.030)
pH: 5 (ref 5.0–8.0)

## 2016-11-25 LAB — CHLAMYDIA/NGC RT PCR (ARMC ONLY)
Chlamydia Tr: NOT DETECTED
N gonorrhoeae: NOT DETECTED

## 2016-11-25 LAB — WET PREP, GENITAL
SPERM: NONE SEEN
TRICH WET PREP: NONE SEEN
Yeast Wet Prep HPF POC: NONE SEEN

## 2016-11-25 LAB — DIFFERENTIAL
BASOS PCT: 0 %
Basophils Absolute: 0 10*3/uL (ref 0–0.1)
EOS PCT: 0 %
Eosinophils Absolute: 0 10*3/uL (ref 0–0.7)
Lymphocytes Relative: 2 %
Lymphs Abs: 0.4 10*3/uL — ABNORMAL LOW (ref 1.0–3.6)
MONO ABS: 0.5 10*3/uL (ref 0.2–0.9)
Monocytes Relative: 2 %
NEUTROS PCT: 96 %
Neutro Abs: 22 10*3/uL — ABNORMAL HIGH (ref 1.4–6.5)

## 2016-11-25 LAB — POCT PREGNANCY, URINE: Preg Test, Ur: NEGATIVE

## 2016-11-25 LAB — LIPASE, BLOOD: Lipase: 24 U/L (ref 11–51)

## 2016-11-25 LAB — CBC
HCT: 38.5 % (ref 35.0–47.0)
HEMOGLOBIN: 12.4 g/dL (ref 12.0–16.0)
MCH: 24 pg — AB (ref 26.0–34.0)
MCHC: 32.3 g/dL (ref 32.0–36.0)
MCV: 74.3 fL — ABNORMAL LOW (ref 80.0–100.0)
Platelets: 291 10*3/uL (ref 150–440)
RBC: 5.18 MIL/uL (ref 3.80–5.20)
RDW: 17.1 % — ABNORMAL HIGH (ref 11.5–14.5)
WBC: 21.9 10*3/uL — ABNORMAL HIGH (ref 3.6–11.0)

## 2016-11-25 MED ORDER — DOXYCYCLINE HYCLATE 100 MG PO CAPS: 100.0000 mg | ORAL_CAPSULE | Freq: Two times a day (BID) | ORAL | 0 refills | Status: DC

## 2016-11-25 MED ORDER — KETOROLAC TROMETHAMINE 30 MG/ML IJ SOLN
30.0000 mg | Freq: Once | INTRAMUSCULAR | Status: AC
  Administered 2016-11-25: 30 mg via INTRAVENOUS
  Filled 2016-11-25: qty 1

## 2016-11-25 MED ORDER — METRONIDAZOLE 500 MG PO TABS: 500.0000 mg | ORAL_TABLET | Freq: Two times a day (BID) | ORAL | 0 refills | Status: DC

## 2016-11-25 MED ORDER — POTASSIUM CHLORIDE CRYS ER 20 MEQ PO TBCR
40.0000 meq | EXTENDED_RELEASE_TABLET | Freq: Once | ORAL | Status: AC
  Administered 2016-11-25: 40 meq via ORAL
  Filled 2016-11-25: qty 2

## 2016-11-25 MED ORDER — SODIUM CHLORIDE 0.9 % IV BOLUS (SEPSIS)
1000.0000 mL | Freq: Once | INTRAVENOUS | Status: AC
  Administered 2016-11-25: 1000 mL via INTRAVENOUS

## 2016-11-25 MED ORDER — AZITHROMYCIN 1 G PO PACK
1.0000 g | PACK | Freq: Once | ORAL | Status: AC
  Administered 2016-11-25: 1 g via ORAL
  Filled 2016-11-25: qty 1

## 2016-11-25 MED ORDER — METRONIDAZOLE 500 MG PO TABS
500.0000 mg | ORAL_TABLET | Freq: Once | ORAL | Status: AC
  Administered 2016-11-25: 500 mg via ORAL
  Filled 2016-11-25: qty 1

## 2016-11-25 MED ORDER — ACETAMINOPHEN 325 MG PO TABS
650.0000 mg | ORAL_TABLET | Freq: Once | ORAL | Status: AC
  Administered 2016-11-25: 650 mg via ORAL
  Filled 2016-11-25: qty 2

## 2016-11-25 MED ORDER — CEFTRIAXONE SODIUM 250 MG IJ SOLR
250.0000 mg | Freq: Once | INTRAMUSCULAR | Status: AC
  Administered 2016-11-25: 250 mg via INTRAMUSCULAR
  Filled 2016-11-25: qty 250

## 2016-11-25 NOTE — Discharge Instructions (Signed)
Take flagyl and doxycyline for 7 days.   Stay hydrated.   Take tylenol, motrin for fever.   See your doctor in a week. Your white blood cell count is high, repeat in a week.   Return to ER if you have fever for a week, vomiting, worse cough, abdominal pain, diarrhea, worse vaginal discharge, dehydration.

## 2016-11-25 NOTE — ED Triage Notes (Signed)
Pt feels like she might be dehydrated and wants to be checked out.

## 2016-11-25 NOTE — ED Provider Notes (Signed)
[facility]-EMERGENCY DEPARTMENT Provider Note   CSN: [id number] Arrival date & time: 11/25/16  1609     History   Chief Complaint Chief Complaint  Patient presents with  . Dehydration    HPI Yvonne Christensen is a 21 y.o. female history of bacterial vaginosis, here presenting with chills, cough, body aches. Patient states that she has some chills since yesterday as well as diffuse body aches. She has a nonproductive cough as well. Denies any vomiting or diarrhea or abdominal pain. Patient states that her daughter are sick with similar symptoms as well. Denies any recent travel or denies eating any uncooked meat. She has poor appetite today and came here because she was concerned she may be dehydrated. She has some vaginal discharge for 2 days as well.   The history is provided by the patient.    Past Medical History:  Diagnosis Date  . Bacterial vaginosis 03/2016   being treated, dosnot remember med name    Patient Active Problem List   Diagnosis Date Noted  . Decreased fetal movement in pregnancy, antepartum 04/23/2016  . Labor and delivery, indication for care 04/21/2016  . Leakage of amniotic fluid 03/25/2016    Past Surgical History:  Procedure Laterality Date  . CESAREAN SECTION    . NO PAST SURGERIES    . WISDOM TOOTH EXTRACTION  2013   g/a    OB History    Gravida Para Term Preterm AB Living   2       1     SAB TAB Ectopic Multiple Live Births                   Home Medications    Prior to Admission medications   Medication Sig Start Date End Date Taking? Authorizing Provider  MedroxyPROGESTERone Acetate 150 MG/ML SUSY Inject 1 mL into the muscle once. 11/19/16  Yes [provider]  Multiple Vitamin (MULTIVITAMIN) tablet Take 1 tablet by mouth daily. Prenatal vitamin   Yes [provider]  cyclobenzaprine (FLEXERIL) 5 MG tablet Take 1 tablet (5 mg total) by mouth 3 (three) times daily as needed for muscle spasms. Patient not taking: Reported  on 11/25/2016 10/22/16   Menshew, Charlesetta Ivory, PA-C    Family History Family History  Problem Relation Age of Onset  . Hypertension Maternal Grandmother   . Cancer Maternal Grandfather     Social History Social History  Substance Use Topics  . Smoking status: Never Smoker  . Smokeless tobacco: Never Used  . Alcohol use Yes     Comment: occ     Allergies   Penicillins   Review of Systems Review of Systems  Constitutional: Positive for chills.  Respiratory: Positive for cough.   Gastrointestinal: Positive for nausea.  All other systems reviewed and are negative.    Physical Exam Updated Vital Signs BP 110/66   Pulse 94   Temp 100.2 F (37.9 C) (Oral)   Resp 18   Ht 5\' 4"  (1.626 m)   Wt 61.7 kg (136 lb)   LMP 08/02/2015   SpO2 100%   Breastfeeding? No   BMI 23.34 kg/m   Physical Exam  Constitutional: She is oriented to person, place, and time. She appears well-developed.  HENT:  Head: Normocephalic.  MM slightly dry, OP not red   Eyes: Pupils are equal, round, and reactive to light. EOM are normal.  Neck: Normal range of motion. Neck supple.  Cardiovascular: Normal rate, regular rhythm and normal heart sounds.  Pulmonary/Chest: Effort normal and breath sounds normal. No respiratory distress. She has no wheezes.  Abdominal: Soft. Bowel sounds are normal. She exhibits no distension. There is no tenderness.  Genitourinary:  Genitourinary Comments: Whitish discharge. No CMT or adnexal tenderness   Musculoskeletal: Normal range of motion.  Neurological: She is alert and oriented to person, place, and time. No cranial nerve deficit. Coordination normal.  Skin: Skin is warm.  Psychiatric: She has a normal mood and affect.  Nursing note and vitals reviewed.    ED Treatments / Results  Labs (all labs ordered are listed, but only abnormal results are displayed) Labs Reviewed  WET PREP, GENITAL - Abnormal; Notable for the following:       Result Value    Clue Cells Wet Prep HPF POC PRESENT (*)    WBC, Wet Prep HPF POC FEW (*)    All other components within normal limits  COMPREHENSIVE METABOLIC PANEL - Abnormal; Notable for the following:    Potassium 3.1 (*)    CO2 21 (*)    Glucose, Bld 113 (*)    All other components within normal limits  CBC - Abnormal; Notable for the following:    WBC 21.9 (*)    MCV 74.3 (*)    MCH 24.0 (*)    RDW 17.1 (*)    All other components within normal limits  URINALYSIS, COMPLETE (UACMP) WITH MICROSCOPIC - Abnormal; Notable for the following:    Color, Urine YELLOW (*)    APPearance HAZY (*)    Specific Gravity, Urine 1.032 (*)    Protein, ur 30 (*)    Leukocytes, UA TRACE (*)    Squamous Epithelial / LPF 0-5 (*)    All other components within normal limits  DIFFERENTIAL - Abnormal; Notable for the following:    Neutro Abs 22.0 (*)    Lymphs Abs 0.4 (*)    All other components within normal limits  CHLAMYDIA/NGC RT PCR ([facility] ONLY)  LIPASE, BLOOD  POC URINE PREG, ED  POCT PREGNANCY, URINE    EKG  EKG Interpretation None       Radiology Dg Chest 2 View  Result Date: 11/25/2016 CLINICAL DATA:  Fever chills and body ache EXAM: CHEST  2 VIEW COMPARISON:  11/02/2011 FINDINGS: The heart size and mediastinal contours are within normal limits. Both lungs are clear. The visualized skeletal structures are unremarkable. IMPRESSION: No active cardiopulmonary disease. Electronically Signed   By: Jasmine PangKim  Fujinaga M.D.   On: 11/25/2016 18:43    Procedures Procedures (including critical care time)  Medications Ordered in ED Medications  metroNIDAZOLE (FLAGYL) tablet 500 mg (not administered)  cefTRIAXone (ROCEPHIN) injection 250 mg (not administered)  azithromycin (ZITHROMAX) powder 1 g (not administered)  sodium chloride 0.9 % bolus 1,000 mL (1,000 mLs Intravenous New Bag/Given 11/25/16 1844)  potassium chloride SA (K-DUR,KLOR-CON) CR tablet 40 mEq (40 mEq Oral Given 11/25/16 1844)    ketorolac (TORADOL) 30 MG/ML injection 30 mg (30 mg Intravenous Given 11/25/16 1844)  acetaminophen (TYLENOL) tablet 650 mg (650 mg Oral Given 11/25/16 1844)     Initial Impression / Assessment and Plan / ED Course  I have reviewed the triage vital signs and the nursing notes.  Pertinent labs & imaging results that were available during my care of the patient were reviewed by me and considered in my medical decision making (see chart for details).     Lenoard AdenValentina T Nordling is a 21 y.o. female here with fever, cough, chills, vaginal discharge. Consider  viral syndrome vs pneumonia vs UTI vs PID. Will get labs, CXR, UA. Will do pelvic exam.    7:58 PM Labs showed WBC 21, mostly neutrophils. UA nl, CXR nl. Wet prep + clue cells. GC/chlamydia pending. Felt better after IVF, toradol, tylenol. I think likely viral etiology. No rash or tick bites. But given leukocytosis and fever, will give rocephin, azithromycin empirically. Will dc home with flagyl, doxycyline for BV, possible PID.    Final Clinical Impressions(s) / ED Diagnoses   Final diagnoses:  None    New Prescriptions New Prescriptions   No medications on file     Charlynne Pander, MD 11/25/16 2001

## 2016-11-25 NOTE — ED Notes (Signed)
Pt transported to xray 

## 2016-12-01 DIAGNOSIS — B373 Candidiasis of vulva and vagina: Secondary | ICD-10-CM | POA: Insufficient documentation

## 2016-12-01 DIAGNOSIS — B3731 Acute candidiasis of vulva and vagina: Secondary | ICD-10-CM | POA: Insufficient documentation

## 2016-12-01 DIAGNOSIS — Z309 Encounter for contraceptive management, unspecified: Secondary | ICD-10-CM | POA: Insufficient documentation

## 2016-12-01 DIAGNOSIS — Z3042 Encounter for surveillance of injectable contraceptive: Secondary | ICD-10-CM | POA: Insufficient documentation

## 2017-05-22 ENCOUNTER — Ambulatory Visit (INDEPENDENT_AMBULATORY_CARE_PROVIDER_SITE_OTHER): Payer: Federal, State, Local not specified - PPO | Admitting: Nurse Practitioner

## 2017-05-22 ENCOUNTER — Encounter: Payer: Self-pay | Admitting: Nurse Practitioner

## 2017-05-22 VITALS — BP 109/73 | HR 94 | Temp 99.0°F | Resp 16 | Ht 63.0 in | Wt 154.0 lb

## 2017-05-22 DIAGNOSIS — N390 Urinary tract infection, site not specified: Secondary | ICD-10-CM

## 2017-05-22 DIAGNOSIS — B379 Candidiasis, unspecified: Secondary | ICD-10-CM | POA: Diagnosis not present

## 2017-05-22 LAB — POCT URINALYSIS DIPSTICK
BILIRUBIN UA: NEGATIVE
Glucose, UA: NEGATIVE
Ketones, UA: NEGATIVE
Leukocytes, UA: NEGATIVE
Nitrite, UA: NEGATIVE
Protein, UA: NEGATIVE
Spec Grav, UA: <=1.005 — AB (ref 1.010–1.025)
UROBILINOGEN UA: 0.2 U/dL
pH, UA: 7.5 (ref 5.0–8.0)

## 2017-05-22 MED ORDER — SULFAMETHOXAZOLE-TRIMETHOPRIM 800-160 MG PO TABS: 1.0000 | ORAL_TABLET | Freq: Two times a day (BID) | ORAL | 0 refills | Status: DC

## 2017-05-22 MED ORDER — FLUCONAZOLE 150 MG PO TABS: ORAL_TABLET | ORAL | 0 refills | Status: DC

## 2017-05-22 NOTE — Progress Notes (Signed)
[facility] [address] [place], [place] [address]  Internal MEDICINE  Office Visit Note  Patient Name: Yvonne Christensen  [id number]  [id number]  Date of Service: 05/22/2017     Complaints/HPI:  Pt is here for a sick visit.  The patient is c/o slight vaginal discharge with slight odor. She does admit to some burning and pain with urination. She also has vaginal itching and irritation which is mild. She denies abdominal pain, nausea, or vimitiing. She has had no fever.      Current Medication:  Outpatient Encounter Medications as of 05/22/2017  Medication Sig  . MedroxyPROGESTERone Acetate 150 MG/ML SUSY Inject 1 mL into the muscle once.  . cyclobenzaprine (FLEXERIL) 5 MG tablet Take 1 tablet (5 mg total) by mouth 3 (three) times daily as needed for muscle spasms. (Patient not taking: Reported on 11/25/2016)  . doxycycline (VIBRAMYCIN) 100 MG capsule Take 1 capsule (100 mg total) by mouth 2 (two) times daily. One po bid x 7 days (Patient not taking: Reported on 05/22/2017)  . metroNIDAZOLE (FLAGYL) 500 MG tablet Take 1 tablet (500 mg total) by mouth 2 (two) times daily. One po bid x 7 days (Patient not taking: Reported on 05/22/2017)  . Multiple Vitamin (MULTIVITAMIN) tablet Take 1 tablet by mouth daily. Prenatal vitamin   No facility-administered encounter medications on file as of 05/22/2017.       Medical History: Past Medical History:  Diagnosis Date  . Bacterial vaginosis 03/2016   being treated, dosnot remember med name    Today's Vitals   05/22/17 1541  BP: 109/73  Pulse: 94  Resp: 16  Temp: 99 F (37.2 C)  SpO2: 97%  Weight: 154 lb (69.9 kg)  Height: 5\' 3"  (1.6 m)    Review of Systems  Constitutional: Negative for activity change, chills and fatigue.  HENT: Negative.   Respiratory: Negative.   Cardiovascular: Negative for chest pain and palpitations.  Gastrointestinal: Negative for constipation, diarrhea, nausea and vomiting.  Endocrine:  Negative.   Genitourinary: Positive for dysuria, flank pain, frequency and vaginal discharge.  Musculoskeletal: Positive for back pain.  Skin: Negative.   Allergic/Immunologic: Negative.   Neurological: Negative for headaches.  Hematological: Negative.   Psychiatric/Behavioral: Negative.     Physical Exam  Constitutional: She is oriented to person, place, and time. She appears well-developed and well-nourished. No distress.  HENT:  Head: Normocephalic and atraumatic.  Mouth/Throat: Oropharynx is clear and moist. No oropharyngeal exudate.  Eyes: EOM are normal. Pupils are equal, round, and reactive to light.  Neck: Normal range of motion. Neck supple. No JVD present. No tracheal deviation present. No thyromegaly present.  Cardiovascular: Normal rate, regular rhythm and normal heart sounds. Exam reveals no gallop and no friction rub.  No murmur heard. Pulmonary/Chest: Effort normal. No respiratory distress. She has no wheezes. She has no rales. She exhibits no tenderness.  Abdominal: Soft. Bowel sounds are normal. There is no tenderness.  Genitourinary:  Genitourinary Comments: Urine sample showing trace of blood.  Musculoskeletal: Normal range of motion.  Lymphadenopathy:    She has no cervical adenopathy.  Neurological: She is alert and oriented to person, place, and time. No cranial nerve deficit.  Skin: Skin is warm and dry. She is not diaphoretic.  Psychiatric: She has a normal mood and affect. Her behavior is normal. Judgment and thought content normal.  Nursing note and vitals reviewed.   Assessment/Plan:   ICD-10-CM   1. Yeast infection B37.9 POCT Urinalysis Dipstick  Urine Culture    fluconazole (DIFLUCAN) 150 MG tablet  2. Urinary tract infection without hematuria, site unspecified N39.0 sulfamethoxazole-trimethoprim (BACTRIM DS,SEPTRA DS) 800-160 MG tablet   1. Diflucan 150mg  one time. May repeat dose in 3 days for persistent symptoms.  2. U/a positive for trace  blood. Will treat for uti with bactrim DS bid for 7 days. Send urine for culture and sensitivity and adjust abx as indicated.  General Counseling : I have discussed the findings of the evaluation and examination with Yvonne Christensen.  I have also discussed any further diagnostic evaluation that may be needed or ordered today. Yvonne Christensen verbalizes understanding of the findings of todays visit. We also reviewed her medications today. she has been encouraged to call the office with any questions or concerns that should arise related to todays visit.  She should follow up as needed and as scheduled   This patient was seen by Vincent GrosHeather Dayton Sherr, FNP- C in Collaboration with Dr Lyndon CodeFozia M Khan as a part of collaborative care agreement   Time spent:15 minutes

## 2017-05-24 LAB — URINE CULTURE: ORGANISM ID, BACTERIA: NO GROWTH

## 2017-06-15 ENCOUNTER — Other Ambulatory Visit: Payer: Self-pay | Admitting: Nurse Practitioner

## 2017-06-15 DIAGNOSIS — B379 Candidiasis, unspecified: Secondary | ICD-10-CM

## 2017-06-15 MED ORDER — FLUCONAZOLE 150 MG PO TABS: ORAL_TABLET | ORAL | 0 refills | Status: DC

## 2017-06-15 NOTE — Progress Notes (Signed)
Resent diflucan 150mg .

## 2017-06-22 ENCOUNTER — Ambulatory Visit: Payer: Self-pay | Admitting: Nurse Practitioner

## 2017-06-22 ENCOUNTER — Other Ambulatory Visit: Payer: Self-pay

## 2017-06-22 MED ORDER — MEDROXYPROGESTERONE ACETATE 150 MG/ML IM SUSY: 1.0000 mL | PREFILLED_SYRINGE | Freq: Once | INTRAMUSCULAR | 3 refills | Status: DC

## 2017-06-23 ENCOUNTER — Encounter: Payer: Self-pay | Admitting: Internal Medicine

## 2017-06-23 DIAGNOSIS — R079 Chest pain, unspecified: Secondary | ICD-10-CM | POA: Insufficient documentation

## 2017-06-23 DIAGNOSIS — Z3202 Encounter for pregnancy test, result negative: Secondary | ICD-10-CM | POA: Insufficient documentation

## 2017-06-23 DIAGNOSIS — Z3042 Encounter for surveillance of injectable contraceptive: Secondary | ICD-10-CM | POA: Insufficient documentation

## 2017-06-27 ENCOUNTER — Ambulatory Visit (INDEPENDENT_AMBULATORY_CARE_PROVIDER_SITE_OTHER): Admitting: Internal Medicine

## 2017-06-27 ENCOUNTER — Encounter: Payer: Self-pay | Admitting: Internal Medicine

## 2017-06-27 ENCOUNTER — Ambulatory Visit: Payer: Self-pay | Admitting: Internal Medicine

## 2017-06-27 VITALS — BP 98/71 | HR 109 | Temp 100.1°F | Resp 16 | Ht 66.0 in | Wt 151.2 lb

## 2017-06-27 DIAGNOSIS — Z3042 Encounter for surveillance of injectable contraceptive: Secondary | ICD-10-CM

## 2017-06-27 DIAGNOSIS — N898 Other specified noninflammatory disorders of vagina: Secondary | ICD-10-CM

## 2017-06-27 DIAGNOSIS — J029 Acute pharyngitis, unspecified: Secondary | ICD-10-CM

## 2017-06-27 MED ORDER — LEVOFLOXACIN 500 MG PO TABS: 500.0000 mg | ORAL_TABLET | Freq: Every day | ORAL | 0 refills | Status: DC

## 2017-06-27 NOTE — Progress Notes (Signed)
[facility] [address] [place], [place] [address]  Internal MEDICINE  Office Visit Note  Patient Name: Yvonne Christensen  [id number]  [id number]  Date of Service: 07/17/2017  Chief Complaint  Patient presents with  . Sore Throat    some fever, since sunday  . Back Pain  . Vaginal Discharge    odor    Sore Throat   This is a new problem. The current episode started in the past 7 days. The problem has been rapidly worsening. The maximum temperature recorded prior to her arrival was 101 - 101.9 F. The pain is moderate. Pertinent negatives include no abdominal pain, congestion, coughing, diarrhea, neck pain, shortness of breath or vomiting.  Back Pain  This is a chronic problem. The problem occurs every several days. The problem has been gradually improving since onset. Pertinent negatives include no abdominal pain, chest pain, dysuria or numbness.  Vaginal Discharge  The patient's primary symptoms include vaginal discharge. This is a recurrent problem. The current episode started 1 to 4 weeks ago. The problem occurs intermittently. The problem has been resolved. Associated symptoms include back pain, flank pain and a sore throat. Pertinent negatives include no abdominal pain, chills, constipation, diarrhea, dysuria, frequency, nausea, rash or vomiting. The vaginal discharge was milky. There has been no bleeding.       Current Medication: Outpatient Encounter Medications as of 06/27/2017  Medication Sig  . levofloxacin (LEVAQUIN) 500 MG tablet Take 1 tablet (500 mg total) by mouth daily.  . MedroxyPROGESTERone Acetate 150 MG/ML SUSY Inject 1 mL (150 mg total) into the muscle once for 1 dose.  . metroNIDAZOLE (FLAGYL) 500 MG tablet Take 1 tablet (500 mg total) by mouth 2 (two) times daily. One po bid x 7 days (Patient not taking: Reported on 05/22/2017)  . Multiple Vitamin (MULTIVITAMIN) tablet Take 1 tablet by mouth daily. Prenatal vitamin  . [DISCONTINUED] cyclobenzaprine  (FLEXERIL) 5 MG tablet Take 1 tablet (5 mg total) by mouth 3 (three) times daily as needed for muscle spasms. (Patient not taking: Reported on 11/25/2016)  . [DISCONTINUED] doxycycline (VIBRAMYCIN) 100 MG capsule Take 1 capsule (100 mg total) by mouth 2 (two) times daily. One po bid x 7 days (Patient not taking: Reported on 05/22/2017)  . [DISCONTINUED] fluconazole (DIFLUCAN) 150 MG tablet Take 1 tablet po once. May repeat in 3 days for persistent symptoms. (Patient not taking: Reported on 06/27/2017)  . [DISCONTINUED] sulfamethoxazole-trimethoprim (BACTRIM DS,SEPTRA DS) 800-160 MG tablet Take 1 tablet by mouth 2 (two) times daily. (Patient not taking: Reported on 06/27/2017)  . [EXPIRED] medroxyPROGESTERone (DEPO-PROVERA) injection 150 mg    No facility-administered encounter medications on file as of 06/27/2017.     Surgical History: Past Surgical History:  Procedure Laterality Date  . CESAREAN SECTION  05/12/2016  . WISDOM TOOTH EXTRACTION  2013   g/a    Medical History: Past Medical History:  Diagnosis Date  . Bacterial vaginosis 03/2016   being treated, dosnot remember med name    Family History: Family History  Problem Relation Age of Onset  . Hypertension Maternal Grandmother   . Cancer Maternal Grandfather     Social History   Socioeconomic History  . Marital status: Single    Spouse name: Not on file  . Number of children: Not on file  . Years of education: Not on file  . Highest education level: Not on file  Social Needs  . Financial resource strain: Not on file  . Food insecurity -  worry: Not on file  . Food insecurity - inability: Not on file  . Transportation needs - medical: Not on file  . Transportation needs - non-medical: Not on file  Occupational History  . Not on file  Tobacco Use  . Smoking status: Never Smoker  . Smokeless tobacco: Never Used  Substance and Sexual Activity  . Alcohol use: Yes    Comment: occ  . Drug use: No  . Sexual activity: Not  on file  Other Topics Concern  . Not on file  Social History Narrative  . Not on file      Review of Systems  Constitutional: Negative for chills, fatigue and unexpected weight change.  HENT: Positive for postnasal drip, rhinorrhea, sinus pain and sore throat. Negative for congestion.   Eyes: Negative for redness.  Respiratory: Negative for cough, chest tightness and shortness of breath.   Cardiovascular: Negative for chest pain and palpitations.  Gastrointestinal: Negative for abdominal pain, constipation, diarrhea, nausea and vomiting.  Genitourinary: Positive for flank pain and vaginal discharge. Negative for dysuria and frequency.  Musculoskeletal: Positive for back pain. Negative for arthralgias, joint swelling and neck pain.  Skin: Negative for rash.  Neurological: Negative.  Negative for tremors and numbness.  Hematological: Negative for adenopathy. Does not bruise/bleed easily.  Psychiatric/Behavioral: Negative for behavioral problems (Depression), sleep disturbance and suicidal ideas. The patient is not nervous/anxious.     Vital Signs: BP 98/71 (BP Location: Left Arm, Patient Position: Sitting)   Pulse (!) 109   Temp 100.1 F (37.8 C) (Oral)   Resp 16   Ht 5\' 6"  (1.676 m)   Wt 151 lb 3.2 oz (68.6 kg)   SpO2 98%   BMI 24.40 kg/m    Physical Exam  Constitutional: She is oriented to person, place, and time. She appears well-developed and well-nourished. No distress.  HENT:  Head: Normocephalic and atraumatic.  Mouth/Throat: Oropharyngeal exudate present.  Eyes: Conjunctivae and EOM are normal. Pupils are equal, round, and reactive to light.  Neck: Normal range of motion. Neck supple. No JVD present. No tracheal deviation present. No thyromegaly present.  Cardiovascular: Normal rate, regular rhythm and normal heart sounds. Exam reveals no gallop and no friction rub.  No murmur heard. Pulmonary/Chest: Effort normal. No respiratory distress. She has no wheezes. She  has no rales. She exhibits no tenderness.  Abdominal: Soft. Bowel sounds are normal.  Musculoskeletal: Normal range of motion.  Lymphadenopathy:    She has no cervical adenopathy.  Neurological: She is alert and oriented to person, place, and time. No cranial nerve deficit.  Skin: Skin is warm and dry. She is not diaphoretic.  Psychiatric: She has a normal mood and affect. Her behavior is normal. Judgment and thought content normal.     Assessment/Plan: 1. Vaginal discharge - POCT Urinalysis Dipstick - Chlamydia/Gonococcus/Trichomonas, NAA  2. Sore throat - POCT rapid strep A - levofloxacin (LEVAQUIN) 500 MG tablet; Take 1 tablet (500 mg total) by mouth daily.  Dispense: 7 tablet; Refill: 0  3. Encounter for management and injection of injectable progestin contraceptive - medroxyPROGESTERone (DEPO-PROVERA) injection 150 mg - POCT urine pregnancy  General Counseling: Callee verbalizes understanding of the findings of todays visit and agrees with plan of treatment. I have discussed any further diagnostic evaluation that may be needed or ordered today. We also reviewed her medications today. she has been encouraged to call the office with any questions or concerns that should arise related to todays visit.   Orders Placed This  Encounter  Procedures  . Chlamydia/Gonococcus/Trichomonas, NAA  . POCT Urinalysis Dipstick  . POCT rapid strep A  . POCT urine pregnancy    Meds ordered this encounter  Medications  . levofloxacin (LEVAQUIN) 500 MG tablet    Sig: Take 1 tablet (500 mg total) by mouth daily.    Dispense:  7 tablet    Refill:  0  . medroxyPROGESTERone (DEPO-PROVERA) injection 150 mg    Time spent:25 Minutes   Dr Lyndon Code Internal medicine

## 2017-06-28 LAB — POCT URINALYSIS DIPSTICK
Bilirubin, UA: NEGATIVE
Blood, UA: NEGATIVE
Glucose, UA: NEGATIVE
Leukocytes, UA: NEGATIVE
Nitrite, UA: NEGATIVE
Protein, UA: NEGATIVE
Spec Grav, UA: 1.015 (ref 1.010–1.025)
Urobilinogen, UA: 0.2 E.U./dL
pH, UA: 6 (ref 5.0–8.0)

## 2017-06-28 LAB — POCT URINE PREGNANCY: PREG TEST UR: NEGATIVE

## 2017-06-28 LAB — POCT RAPID STREP A (OFFICE): Rapid Strep A Screen: POSITIVE — AB

## 2017-06-28 MED ORDER — MEDROXYPROGESTERONE ACETATE 150 MG/ML IM SUSP
150.0000 mg | Freq: Once | INTRAMUSCULAR | Status: AC
  Administered 2017-06-27: 150 mg via INTRAMUSCULAR

## 2017-07-01 LAB — CHLAMYDIA/GONOCOCCUS/TRICHOMONAS, NAA
Chlamydia by NAA: NEGATIVE
Gonococcus by NAA: NEGATIVE
Trich vag by NAA: NEGATIVE

## 2017-07-12 ENCOUNTER — Other Ambulatory Visit: Payer: Self-pay

## 2017-07-12 DIAGNOSIS — B379 Candidiasis, unspecified: Secondary | ICD-10-CM

## 2017-07-12 MED ORDER — FLUCONAZOLE 150 MG PO TABS: ORAL_TABLET | ORAL | 0 refills | Status: DC

## 2017-07-12 NOTE — Telephone Encounter (Signed)
Pt called having yeast infection and itching and white discharge she was on antibiotic before a per dfk send diflucan

## 2017-08-03 ENCOUNTER — Other Ambulatory Visit: Payer: Self-pay

## 2017-08-04 ENCOUNTER — Other Ambulatory Visit: Payer: Self-pay

## 2017-08-04 MED ORDER — TRIAMCINOLONE ACETONIDE 0.1 % EX CREA: 1.0000 "application " | TOPICAL_CREAM | Freq: Two times a day (BID) | CUTANEOUS | 3 refills | Status: DC

## 2017-08-07 ENCOUNTER — Encounter: Payer: Self-pay | Admitting: *Deleted

## 2017-08-07 ENCOUNTER — Telehealth: Payer: Self-pay

## 2017-08-07 ENCOUNTER — Ambulatory Visit
Admission: EM | Admit: 2017-08-07 | Discharge: 2017-08-07 | Disposition: A | Discharge status: Home / Self Care | Attending: Family Medicine | Admitting: Family Medicine

## 2017-08-07 DIAGNOSIS — R21 Rash and other nonspecific skin eruption: Secondary | ICD-10-CM | POA: Diagnosis not present

## 2017-08-07 MED ORDER — DESONIDE 0.05 % EX CREA: TOPICAL_CREAM | Freq: Two times a day (BID) | CUTANEOUS | 0 refills | Status: DC

## 2017-08-07 NOTE — ED Provider Notes (Signed)
[facility]   CSN: [id number] Arrival date & time: 08/07/17  1626  History   Chief Complaint Chief Complaint  Patient presents with  . Rash   HPI  22 year old female presents with facial rash.  Patient reports that she has had an area above her right eye for the past few weeks.  She now has developed additional areas on the right cheek and the nose.  She states that it is red and itchy.  Hyperpigmented as well.  No new exposures.  No known exacerbating factors.  No relief with Benadryl.  No other associated symptoms.  No other complaints.  Past Medical History:  Diagnosis Date  . Bacterial vaginosis 03/2016   being treated, dosnot remember med name   Patient Active Problem List   Diagnosis Date Noted  . Chest pain, unspecified 06/23/2017  . Encounter for surveillance of injectable contraceptive 06/23/2017  . Encounter for pregnancy test, result negative 06/23/2017  . Contraception management 12/01/2016  . Encounter to establish care with new doctor 12/01/2016  . Vaginitis due to Candida 12/01/2016  . Decreased fetal movement in pregnancy, antepartum 04/23/2016  . Labor and delivery, indication for care 04/21/2016  . Leakage of amniotic fluid 03/25/2016  . History of chlamydia 01/01/2016   Past Surgical History:  Procedure Laterality Date  . CESAREAN SECTION  05/12/2016  . WISDOM TOOTH EXTRACTION  2013   g/a   OB History    Gravida  2   Para      Term      Preterm      AB  1   Living        SAB      TAB      Ectopic      Multiple      Live Births             Home Medications    Prior to Admission medications   Medication Sig Start Date End Date Taking? Authorizing Provider  Multiple Vitamin (MULTIVITAMIN) tablet Take 1 tablet by mouth daily. Prenatal vitamin   Yes [provider]  triamcinolone cream (KENALOG) 0.1 % Apply 1 application topically 2 (two) times daily. Apply to affected area twice daily for up to 2 weeks for  eczema 08/04/17  Yes Boscia, Heather E, NP  desonide (DESOWEN) 0.05 % cream Apply topically 2 (two) times daily. 08/07/17   Tommie Samsook, Natale Barba G, DO   Family History Family History  Problem Relation Age of Onset  . Hypertension Maternal Grandmother   . Cancer Maternal Grandfather   . Healthy Mother   . Cirrhosis Father    Social History Social History   Tobacco Use  . Smoking status: Never Smoker  . Smokeless tobacco: Never Used  Substance Use Topics  . Alcohol use: Yes    Comment: occ  . Drug use: No   Allergies   Penicillins  Review of Systems Review of Systems  Constitutional: Negative.   Skin: Positive for rash.   Physical Exam Triage Vital Signs ED Triage Vitals  Enc Vitals Group     BP 08/07/17 1637 111/71     Pulse Rate 08/07/17 1637 85     Resp 08/07/17 1637 16     Temp 08/07/17 1637 98.5 F (36.9 C)     Temp Source 08/07/17 1637 Oral     SpO2 08/07/17 1637 100 %     Weight 08/07/17 1640 148 lb (67.1 kg)     Height 08/07/17 1640 5\' 5"  (1.651  m)     Head Circumference --      Peak Flow --      Pain Score 08/07/17 1639 0     Pain Loc --      Pain Edu? --      Excl. in GC? --    Updated Vital Signs BP 111/71 (BP Location: Left Arm)   Pulse 85   Temp 98.5 F (36.9 C) (Oral)   Resp 16   Ht 5\' 5"  (1.651 m)   Wt 148 lb (67.1 kg)   SpO2 100%   BMI 24.63 kg/m   Physical Exam  Constitutional: She is oriented to person, place, and time. She appears well-developed. No distress.  Cardiovascular: Normal rate and regular rhythm.  Pulmonary/Chest: Effort normal and breath sounds normal. She has no wheezes. She has no rales.  Neurological: She is alert and oriented to person, place, and time.  Skin:  Patient with 3-4 pigmented areas on the face.  Larger lesion on the right maxillary region with mild erythema.  Psychiatric: She has a normal mood and affect. Her behavior is normal.  Nursing note and vitals reviewed.  UC Treatments / Results  Labs (all labs  ordered are listed, but only abnormal results are displayed) Labs Reviewed - No data to display  EKG None Radiology No results found.  Procedures Procedures (including critical care time)  Medications Ordered in UC Medications - No data to display   Initial Impression / Assessment and Plan / UC Course  I have reviewed the triage vital signs and the nursing notes.  Pertinent labs & imaging results that were available during my care of the patient were reviewed by me and considered in my medical decision making (see chart for details).     22 year old female presents with facial rash.  Trial of empiric desonide.  Advised to see dermatology.  Final Clinical Impressions(s) / UC Diagnoses   Final diagnoses:  Rash    ED Discharge Orders        Ordered    desonide (DESOWEN) 0.05 % cream  2 times daily     08/07/17 1702     Controlled Substance Prescriptions [place] Controlled Substance Registry consulted? Not Applicable   Tommie Sams, DO 08/07/17 1721

## 2017-08-07 NOTE — Discharge Instructions (Signed)
Use the topical briefly.  Call derm.  Take care  Dr. Adriana Simasook

## 2017-08-07 NOTE — ED Triage Notes (Signed)
Gradual onset facial rash x1 week.

## 2017-08-07 NOTE — Telephone Encounter (Signed)
Patient called and stated for the last few weeks she has been breaking out in a rash and started to spread, I offered patient appt today and tomorrow at 1:45 and she was unable to scdh due to conflicts in time. Patient will see urgent care.Yvonne Christensen/BR

## 2017-09-12 ENCOUNTER — Other Ambulatory Visit: Payer: Self-pay

## 2017-09-12 MED ORDER — MEDROXYPROGESTERONE ACETATE 150 MG/ML IM SUSP: 150.0000 mg | INTRAMUSCULAR | 5 refills | Status: DC

## 2017-09-26 ENCOUNTER — Encounter: Payer: Self-pay | Admitting: Nurse Practitioner

## 2017-09-26 ENCOUNTER — Ambulatory Visit (INDEPENDENT_AMBULATORY_CARE_PROVIDER_SITE_OTHER): Payer: Medicaid Other | Admitting: Nurse Practitioner

## 2017-09-26 VITALS — BP 120/79 | HR 92 | Resp 16 | Ht 65.0 in | Wt 147.6 lb

## 2017-09-26 DIAGNOSIS — J0140 Acute pansinusitis, unspecified: Secondary | ICD-10-CM

## 2017-09-26 DIAGNOSIS — B373 Candidiasis of vulva and vagina: Secondary | ICD-10-CM

## 2017-09-26 DIAGNOSIS — B3731 Acute candidiasis of vulva and vagina: Secondary | ICD-10-CM

## 2017-09-26 DIAGNOSIS — Z308 Encounter for other contraceptive management: Secondary | ICD-10-CM

## 2017-09-26 MED ORDER — AZITHROMYCIN 250 MG PO TABS: ORAL_TABLET | ORAL | 0 refills | Status: DC

## 2017-09-26 MED ORDER — MEDROXYPROGESTERONE ACETATE 150 MG/ML IM SUSP
150.0000 mg | Freq: Once | INTRAMUSCULAR | Status: AC
  Administered 2017-09-26: 150 mg via INTRAMUSCULAR

## 2017-09-26 MED ORDER — FLUCONAZOLE 150 MG PO TABS: ORAL_TABLET | ORAL | 0 refills | Status: DC

## 2017-09-26 MED ORDER — MEDROXYPROGESTERONE ACETATE 150 MG/ML IM SUSP: 150.0000 mg | Freq: Once | INTRAMUSCULAR | Status: DC

## 2017-09-26 NOTE — Progress Notes (Signed)
[facility] [address] [place], [place] [address]  Internal MEDICINE  Office Visit Note  Patient Name: Yvonne Christensen  [id number]  [id number]  Date of Service: 10/21/2017   Pt is here for routine follow up.   Chief Complaint  Patient presents with  . Sinusitis    Sinusitis  This is a new problem. The current episode started in the past 7 days. The problem is unchanged. There has been no fever. Her pain is at a severity of 0/10. Associated symptoms include chills, congestion, coughing, ear pain, headaches, sinus pressure, sneezing, a sore throat and swollen glands. Past treatments include acetaminophen and oral decongestants. The treatment provided mild relief.       Current Medication: Outpatient Encounter Medications as of 09/26/2017  Medication Sig  . medroxyPROGESTERone (DEPO-PROVERA) 150 MG/ML injection Inject 1 mL (150 mg total) into the muscle every 3 (three) months.  . triamcinolone cream (KENALOG) 0.1 % Apply 1 application topically 2 (two) times daily. Apply to affected area twice daily for up to 2 weeks for eczema  . azithromycin (ZITHROMAX) 250 MG tablet z-pack - take as directed for 5 days  . desonide (DESOWEN) 0.05 % cream Apply topically 2 (two) times daily. (Patient not taking: Reported on 09/26/2017)  . fluconazole (DIFLUCAN) 150 MG tablet Take 1 tablet po once. May repeat dose in 3 days as needed for persistent symptoms.  . Multiple Vitamin (MULTIVITAMIN) tablet Take 1 tablet by mouth daily. Prenatal vitamin  . [EXPIRED] medroxyPROGESTERone (DEPO-PROVERA) injection 150 mg   . [DISCONTINUED] medroxyPROGESTERone (DEPO-PROVERA) injection 150 mg    No facility-administered encounter medications on file as of 09/26/2017.     Surgical History: Past Surgical History:  Procedure Laterality Date  . CESAREAN SECTION  05/12/2016  . WISDOM TOOTH EXTRACTION  2013   g/a    Medical History: Past Medical History:  Diagnosis Date  . Bacterial vaginosis  03/2016   being treated, dosnot remember med name    Family History: Family History  Problem Relation Age of Onset  . Hypertension Maternal Grandmother   . Cancer Maternal Grandfather   . Healthy Mother   . Cirrhosis Father     Social History   Socioeconomic History  . Marital status: Single    Spouse name: Not on file  . Number of children: Not on file  . Years of education: Not on file  . Highest education level: Not on file  Occupational History  . Not on file  Social Needs  . Financial resource strain: Not on file  . Food insecurity:    Worry: Not on file    Inability: Not on file  . Transportation needs:    Medical: Not on file    Non-medical: Not on file  Tobacco Use  . Smoking status: Never Smoker  . Smokeless tobacco: Never Used  Substance and Sexual Activity  . Alcohol use: Not Currently  . Drug use: No  . Sexual activity: Yes  Lifestyle  . Physical activity:    Days per week: Not on file    Minutes per session: Not on file  . Stress: Not on file  Relationships  . Social connections:    Talks on phone: Not on file    Gets together: Not on file    Attends religious service: Not on file    Active member of club or organization: Not on file    Attends meetings of clubs or organizations: Not on file    Relationship status:  Not on file  . Intimate partner violence:    Fear of current or ex partner: Not on file    Emotionally abused: Not on file    Physically abused: Not on file    Forced sexual activity: Not on file  Other Topics Concern  . Not on file  Social History Narrative  . Not on file      Review of Systems  Constitutional: Positive for chills and fatigue.  HENT: Positive for congestion, ear pain, postnasal drip, rhinorrhea, sinus pressure, sinus pain, sneezing, sore throat and voice change.   Eyes: Negative.   Respiratory: Positive for cough and wheezing.   Cardiovascular: Negative for chest pain and palpitations.  Gastrointestinal:  Positive for nausea.  Endocrine: Negative for cold intolerance, heat intolerance, polydipsia, polyphagia and polyuria.  Genitourinary: Negative.   Musculoskeletal: Positive for myalgias.  Skin: Negative.   Allergic/Immunologic: Positive for environmental allergies.  Neurological: Positive for headaches.  Hematological: Positive for adenopathy.  Psychiatric/Behavioral: Negative for agitation and confusion. The patient is not nervous/anxious.     Vital Signs: BP 120/79   Pulse 92   Resp 16   Ht  (1.651 m)   Wt 147 lb 9.6 oz (67 kg)   SpO2 98%   BMI 24.56 kg/m    Physical Exam  Constitutional: She is oriented to person, place, and time. She appears well-developed and well-nourished. No distress.  HENT:  Head: Normocephalic and atraumatic.  Right Ear: No swelling or tenderness. Tympanic membrane is erythematous and bulging.  Left Ear: No swelling or tenderness. Tympanic membrane is erythematous and bulging.  Nose: Rhinorrhea present. Right sinus exhibits maxillary sinus tenderness and frontal sinus tenderness. Left sinus exhibits maxillary sinus tenderness and frontal sinus tenderness.  Mouth/Throat: Mucous membranes are normal. Posterior oropharyngeal erythema present. No oropharyngeal exudate.  Eyes: Pupils are equal, round, and reactive to light. EOM are normal.  Neck: Normal range of motion. Neck supple. No JVD present. No tracheal deviation present. No thyromegaly present.  Cardiovascular: Normal rate, regular rhythm and normal heart sounds. Exam reveals no gallop and no friction rub.  No murmur heard. Pulmonary/Chest: Effort normal and breath sounds normal. No respiratory distress. She has no wheezes. She has no rales. She exhibits no tenderness.  Abdominal: Soft. Bowel sounds are normal. There is no tenderness.  Musculoskeletal: Normal range of motion.  Lymphadenopathy:    She has cervical adenopathy.  Neurological: She is alert and oriented to person, place, and time.  No cranial nerve deficit.  Skin: Skin is warm and dry. She is not diaphoretic.  Psychiatric: She has a normal mood and affect. Her behavior is normal. Judgment and thought content normal.  Nursing note and vitals reviewed.  Assessment/Plan: 1. Acute non-recurrent pansinusitis Start z-pack. Take as directed for 5 days. Use OTC medication to treat symptoms. - azithromycin (ZITHROMAX) 250 MG tablet; z-pack - take as directed for 5 days  Dispense: 6 tablet; Refill: 0  2. Vaginal yeast infection Diflucan prescribed in case yeast infection develops. Use as prescribed. Will send urine sample to check for sexually transmitted infection.  - fluconazole (DIFLUCAN) 150 MG tablet; Take 1 tablet po once. May repeat dose in 3 days as needed for persistent symptoms.  Dispense: 3 tablet; Refill: 0 - Chlamydia/Gonococcus/Trichomonas, NAA  3. Encounter for other contraceptive management Urine negative for pregnancy. Depo-provera infection administered in the office.  - medroxyPROGESTERone (DEPO-PROVERA) injection 150 mg - POCT urine pregnancy - Chlamydia/Gonococcus/Trichomonas, NAA  General Counseling: Rebacca verbalizes understanding of the  findings of todays visit and agrees with plan of treatment. I have discussed any further diagnostic evaluation that may be needed or ordered today. We also reviewed her medications today. she has been encouraged to call the office with any questions or concerns that should arise related to todays visit.    Counseling:  This patient was seen by Vincent Gros, FNP- C in Collaboration with Dr Lyndon Code as a part of collaborative care agreement  Orders Placed This Encounter  Procedures  . Chlamydia/Gonococcus/Trichomonas, NAA  . POCT urine pregnancy    Meds ordered this encounter  Medications  . azithromycin (ZITHROMAX) 250 MG tablet    Sig: z-pack - take as directed for 5 days    Dispense:  6 tablet    Refill:  0    Order Specific Question:    Supervising Provider    Answer:   Lyndon Code [[id number]]  . fluconazole (DIFLUCAN) 150 MG tablet    Sig: Take 1 tablet po once. May repeat dose in 3 days as needed for persistent symptoms.    Dispense:  3 tablet    Refill:  0    Order Specific Question:   Supervising Provider    Answer:   Lyndon Code [[id number]]  . DISCONTD: medroxyPROGESTERone (DEPO-PROVERA) injection 150 mg  . medroxyPROGESTERone (DEPO-PROVERA) injection 150 mg    Time spent: 15 Minutes     Dr Lyndon Code Internal medicine

## 2017-09-29 LAB — CHLAMYDIA/GONOCOCCUS/TRICHOMONAS, NAA
CHLAMYDIA BY NAA: NEGATIVE
Gonococcus by NAA: NEGATIVE
Trich vag by NAA: NEGATIVE

## 2017-10-06 ENCOUNTER — Telehealth: Payer: Self-pay

## 2017-10-06 NOTE — Telephone Encounter (Signed)
Spoke to pt and informed her that her labs were normal.  dbs

## 2017-10-21 DIAGNOSIS — J0140 Acute pansinusitis, unspecified: Secondary | ICD-10-CM | POA: Insufficient documentation

## 2017-10-21 LAB — POCT URINE PREGNANCY: Preg Test, Ur: NEGATIVE

## 2017-11-07 ENCOUNTER — Other Ambulatory Visit: Payer: Self-pay | Admitting: Nurse Practitioner

## 2017-11-07 ENCOUNTER — Telehealth: Payer: Self-pay | Admitting: Nurse Practitioner

## 2017-11-07 DIAGNOSIS — B3731 Acute candidiasis of vulva and vagina: Secondary | ICD-10-CM

## 2017-11-07 DIAGNOSIS — B373 Candidiasis of vulva and vagina: Secondary | ICD-10-CM

## 2017-11-07 MED ORDER — FLUCONAZOLE 150 MG PO TABS: ORAL_TABLET | ORAL | 0 refills | Status: DC

## 2017-11-07 NOTE — Telephone Encounter (Signed)
Sent diflucan 150mg  once to pharmacy. May repeat dose in 3 days for persistent symptoms. Sent ot [facility].

## 2017-11-07 NOTE — Telephone Encounter (Signed)
Called patient and left voicemail letting her know that the difulcan 150mg  prescription was sent into her pharmacy

## 2017-11-07 NOTE — Progress Notes (Signed)
Sent diflucan 150mg once to pharmacy. May repeat dose in 3 days for persistent symptoms. Sent ot [facility].

## 2017-12-19 ENCOUNTER — Other Ambulatory Visit: Payer: Self-pay

## 2017-12-19 DIAGNOSIS — B373 Candidiasis of vulva and vagina: Secondary | ICD-10-CM

## 2017-12-19 DIAGNOSIS — B3731 Acute candidiasis of vulva and vagina: Secondary | ICD-10-CM

## 2017-12-19 MED ORDER — FLUCONAZOLE 150 MG PO TABS: ORAL_TABLET | ORAL | 0 refills | Status: DC

## 2017-12-19 NOTE — Telephone Encounter (Signed)
Pt called she had yeast infections as per heather send diflucan and pt had appt coming up next week

## 2017-12-29 ENCOUNTER — Ambulatory Visit: Payer: Self-pay | Admitting: Nurse Practitioner

## 2018-01-05 ENCOUNTER — Encounter: Payer: Self-pay | Admitting: Nurse Practitioner

## 2018-01-05 ENCOUNTER — Ambulatory Visit (INDEPENDENT_AMBULATORY_CARE_PROVIDER_SITE_OTHER): Admitting: Nurse Practitioner

## 2018-01-05 VITALS — BP 107/84 | HR 71 | Resp 16 | Ht 66.0 in | Wt 155.2 lb

## 2018-01-05 DIAGNOSIS — Z30013 Encounter for initial prescription of injectable contraceptive: Secondary | ICD-10-CM

## 2018-01-05 DIAGNOSIS — Z3042 Encounter for surveillance of injectable contraceptive: Secondary | ICD-10-CM

## 2018-01-05 DIAGNOSIS — B373 Candidiasis of vulva and vagina: Secondary | ICD-10-CM | POA: Diagnosis not present

## 2018-01-05 DIAGNOSIS — B3731 Acute candidiasis of vulva and vagina: Secondary | ICD-10-CM

## 2018-01-05 LAB — POCT URINE PREGNANCY: PREG TEST UR: NEGATIVE

## 2018-01-05 MED ORDER — FLUCONAZOLE 150 MG PO TABS: ORAL_TABLET | ORAL | 3 refills | Status: DC

## 2018-01-05 MED ORDER — MEDROXYPROGESTERONE ACETATE 150 MG/ML IM SUSP
150.0000 mg | Freq: Once | INTRAMUSCULAR | Status: AC
  Administered 2018-01-05: 150 mg via INTRAMUSCULAR

## 2018-01-05 NOTE — Progress Notes (Signed)
[facility] [address] [place], [place] [address]  Internal MEDICINE  Office Visit Note  Patient Name: Yvonne Christensen  [id number]  [id number]  Date of Service: 01/05/2018  Chief Complaint  Patient presents with  . Pain    breast tenderness for the past couple of days, pt is here for her depo shot.    The patient is c/o generalized breast tenderness. This has been gradually getting worse over the past 2 weeks. Hurts more after she takes her bra off. Has noted no drainage or discharge from the nipples. Denies lumps or abnormalities.  Getting frequent yeast infections. She is in the [employer], and spends long hours in uniform. This is very hot and material does not breath. Will usually take diflucan to help which will relieve infection.       Current Medication: Outpatient Encounter Medications as of 01/05/2018  Medication Sig  . medroxyPROGESTERone (DEPO-PROVERA) 150 MG/ML injection Inject 1 mL (150 mg total) into the muscle every 3 (three) months.  . triamcinolone cream (KENALOG) 0.1 % Apply 1 application topically 2 (two) times daily. Apply to affected area twice daily for up to 2 weeks for eczema  . [DISCONTINUED] fluconazole (DIFLUCAN) 150 MG tablet Take 1 tablet po once. May repeat dose in 3 days as needed for persistent symptoms.  Marland Kitchen azithromycin (ZITHROMAX) 250 MG tablet z-pack - take as directed for 5 days (Patient not taking: Reported on 01/05/2018)  . desonide (DESOWEN) 0.05 % cream Apply topically 2 (two) times daily. (Patient not taking: Reported on 09/26/2017)  . fluconazole (DIFLUCAN) 150 MG tablet Take 1 tablet po once. May repeat dose in 3 days as needed for persistent symptoms.  . Multiple Vitamin (MULTIVITAMIN) tablet Take 1 tablet by mouth daily. Prenatal vitamin  . [EXPIRED] medroxyPROGESTERone (DEPO-PROVERA) injection 150 mg    No facility-administered encounter medications on file as of 01/05/2018.     Surgical History: Past Surgical History:   Procedure Laterality Date  . CESAREAN SECTION  05/12/2016  . WISDOM TOOTH EXTRACTION  2013   g/a    Medical History: Past Medical History:  Diagnosis Date  . Bacterial vaginosis 03/2016   being treated, dosnot remember med name    Family History: Family History  Problem Relation Age of Onset  . Hypertension Maternal Grandmother   . Cancer Maternal Grandfather   . Healthy Mother   . Cirrhosis Father     Social History   Socioeconomic History  . Marital status: Single    Spouse name: Not on file  . Number of children: Not on file  . Years of education: Not on file  . Highest education level: Not on file  Occupational History  . Not on file  Social Needs  . Financial resource strain: Not on file  . Food insecurity:    Worry: Not on file    Inability: Not on file  . Transportation needs:    Medical: Not on file    Non-medical: Not on file  Tobacco Use  . Smoking status: Never Smoker  . Smokeless tobacco: Never Used  Substance and Sexual Activity  . Alcohol use: Not Currently  . Drug use: No  . Sexual activity: Yes  Lifestyle  . Physical activity:    Days per week: Not on file    Minutes per session: Not on file  . Stress: Not on file  Relationships  . Social connections:    Talks on phone: Not on file    Gets together: Not  on file    Attends religious service: Not on file    Active member of club or organization: Not on file    Attends meetings of clubs or organizations: Not on file    Relationship status: Not on file  . Intimate partner violence:    Fear of current or ex partner: Not on file    Emotionally abused: Not on file    Physically abused: Not on file    Forced sexual activity: Not on file  Other Topics Concern  . Not on file  Social History Narrative  . Not on file      Review of Systems  Constitutional: Negative for chills, fatigue and unexpected weight change.  HENT: Negative for congestion, postnasal drip, rhinorrhea, sneezing and  sore throat.   Eyes: Negative.  Negative for redness.  Respiratory: Negative for cough, chest tightness, shortness of breath and wheezing.   Cardiovascular: Negative for chest pain and palpitations.  Gastrointestinal: Negative for abdominal pain, constipation, diarrhea, nausea and vomiting.  Endocrine: Negative for cold intolerance, heat intolerance, polydipsia, polyphagia and polyuria.  Genitourinary: Negative for dysuria and frequency.       Intermittent vaginal itching and discharge. Spotty menstrual cycles due to depo-provera injection. Has generalized breast tenderness.   Musculoskeletal: Negative for arthralgias, back pain, joint swelling and neck pain.  Skin: Negative for rash.  Allergic/Immunologic: Negative for environmental allergies.  Neurological: Negative for dizziness, tremors and numbness.  Hematological: Negative for adenopathy. Does not bruise/bleed easily.  Psychiatric/Behavioral: Negative for behavioral problems (Depression), sleep disturbance and suicidal ideas. The patient is not nervous/anxious.     Today's Vitals   01/05/18 0845  BP: 107/84  Pulse: 71  Resp: 16  SpO2: 99%  Weight: 155 lb 3.2 oz (70.4 kg)  Height: 5\' 6"  (1.676 m)    Physical Exam  Constitutional: She is oriented to person, place, and time. She appears well-developed and well-nourished. No distress.  HENT:  Head: Normocephalic and atraumatic.  Mouth/Throat: Oropharynx is clear and moist. No oropharyngeal exudate.  Eyes: Pupils are equal, round, and reactive to light. EOM are normal.  Neck: Normal range of motion. Neck supple. No JVD present. No tracheal deviation present. No thyromegaly present.  Cardiovascular: Normal rate, regular rhythm and normal heart sounds. Exam reveals no gallop and no friction rub.  No murmur heard. Pulmonary/Chest: Effort normal and breath sounds normal. No respiratory distress. She has no wheezes. She has no rales. She exhibits no tenderness.  Abdominal: Soft. Bowel  sounds are normal.  Genitourinary:  Genitourinary Comments: Urine pregnancy test negative.  Musculoskeletal: Normal range of motion.  Lymphadenopathy:    She has no cervical adenopathy.  Neurological: She is alert and oriented to person, place, and time. No cranial nerve deficit.  Skin: Skin is warm and dry. She is not diaphoretic.  Psychiatric: She has a normal mood and affect. Her behavior is normal. Judgment and thought content normal.  Nursing note and vitals reviewed.  Assessment/Plan: 1. Vaginal yeast infection Add diflucan 150mg  when needed. Take as needed and as prescribed.  - fluconazole (DIFLUCAN) 150 MG tablet; Take 1 tablet po once. May repeat dose in 3 days as needed for persistent symptoms.  Dispense: 3 tablet; Refill: 3  2. Encounter for management and injection of injectable progestin contraceptive  Pregnancy test negative. Depo-provera injection administered today. Advised her to monitor breast pain and will see her back if no improvement over next week or so.  - POCT urine pregnancy - medroxyPROGESTERone (DEPO-PROVERA) injection  150 mg   General Counseling: Mariane verbalizes understanding of the findings of todays visit and agrees with plan of treatment. I have discussed any further diagnostic evaluation that may be needed or ordered today. We also reviewed her medications today. she has been encouraged to call the office with any questions or concerns that should arise related to todays visit.  This patient was seen by Vincent Gros FNP Collaboration with Dr Lyndon Code as a part of collaborative care agreement   Orders Placed This Encounter  Procedures  . POCT urine pregnancy    Meds ordered this encounter  Medications  . fluconazole (DIFLUCAN) 150 MG tablet    Sig: Take 1 tablet po once. May repeat dose in 3 days as needed for persistent symptoms.    Dispense:  3 tablet    Refill:  3    Order Specific Question:   Supervising Provider    Answer:   Lyndon Code [[id number]]  . medroxyPROGESTERone (DEPO-PROVERA) injection 150 mg    Time spent: 1 Minutes      Dr Lyndon Code Internal medicine

## 2018-04-02 ENCOUNTER — Other Ambulatory Visit: Payer: Self-pay

## 2018-04-02 MED ORDER — MEDROXYPROGESTERONE ACETATE 150 MG/ML IM SUSP: 150.0000 mg | INTRAMUSCULAR | 5 refills | Status: DC

## 2018-04-03 ENCOUNTER — Ambulatory Visit: Payer: Self-pay | Admitting: Nurse Practitioner

## 2018-04-04 ENCOUNTER — Encounter: Payer: Self-pay | Admitting: Adult Health

## 2018-04-04 ENCOUNTER — Ambulatory Visit (INDEPENDENT_AMBULATORY_CARE_PROVIDER_SITE_OTHER): Admitting: Adult Health

## 2018-04-04 VITALS — BP 112/82 | HR 69 | Resp 16 | Ht 66.0 in | Wt 155.0 lb

## 2018-04-04 DIAGNOSIS — L309 Dermatitis, unspecified: Secondary | ICD-10-CM

## 2018-04-04 DIAGNOSIS — Z3042 Encounter for surveillance of injectable contraceptive: Secondary | ICD-10-CM | POA: Diagnosis not present

## 2018-04-04 LAB — POCT URINE PREGNANCY: Preg Test, Ur: NEGATIVE

## 2018-04-04 MED ORDER — MEDROXYPROGESTERONE ACETATE 150 MG/ML IM SUSP
150.0000 mg | Freq: Once | INTRAMUSCULAR | Status: AC
  Administered 2018-04-04: 150 mg via INTRAMUSCULAR

## 2018-04-04 NOTE — Progress Notes (Signed)
[facility] [address] [place], [place] [address]  Internal MEDICINE  Office Visit Note  Patient Name: Yvonne Christensen  [id number]  [id number]  Date of Service: 04/04/2018  Chief Complaint  Patient presents with  . Contraception    HPI Patient is here for follow-up on her contraception.  She has been taking Depo-Provera for the last 2 years.  She denies any significant weight gain.  She also denies any history of migraines with aura, blood clots, reproductive cancers.  She states that she occasionally has a few days of spotty bleeding however she does not have a significant.  While on the depth.  She is very happy with her results and would like to continue her tobacco at this time.    Current Medication: Outpatient Encounter Medications as of 04/04/2018  Medication Sig  . medroxyPROGESTERone (DEPO-PROVERA) 150 MG/ML injection Inject 1 mL (150 mg total) into the muscle every 3 (three) months.  . Multiple Vitamin (MULTIVITAMIN) tablet Take 1 tablet by mouth daily. Prenatal vitamin  . triamcinolone cream (KENALOG) 0.1 % Apply 1 application topically 2 (two) times daily. Apply to affected area twice daily for up to 2 weeks for eczema  . [DISCONTINUED] azithromycin (ZITHROMAX) 250 MG tablet z-pack - take as directed for 5 days (Patient not taking: Reported on 01/05/2018)  . [DISCONTINUED] desonide (DESOWEN) 0.05 % cream Apply topically 2 (two) times daily. (Patient not taking: Reported on 09/26/2017)  . [DISCONTINUED] fluconazole (DIFLUCAN) 150 MG tablet Take 1 tablet po once. May repeat dose in 3 days as needed for persistent symptoms. (Patient not taking: Reported on 04/04/2018)  . [EXPIRED] medroxyPROGESTERone (DEPO-PROVERA) injection 150 mg    No facility-administered encounter medications on file as of 04/04/2018.     Surgical History: Past Surgical History:  Procedure Laterality Date  . CESAREAN SECTION  05/12/2016  . WISDOM TOOTH EXTRACTION  2013   g/a     Medical History: Past Medical History:  Diagnosis Date  . Bacterial vaginosis 03/2016   being treated, dosnot remember med name    Family History: Family History  Problem Relation Age of Onset  . Hypertension Maternal Grandmother   . Cancer Maternal Grandfather   . Healthy Mother   . Cirrhosis Father     Social History   Socioeconomic History  . Marital status: Single    Spouse name: Not on file  . Number of children: Not on file  . Years of education: Not on file  . Highest education level: Not on file  Occupational History  . Not on file  Social Needs  . Financial resource strain: Not on file  . Food insecurity:    Worry: Not on file    Inability: Not on file  . Transportation needs:    Medical: Not on file    Non-medical: Not on file  Tobacco Use  . Smoking status: Never Smoker  . Smokeless tobacco: Never Used  Substance and Sexual Activity  . Alcohol use: Not Currently  . Drug use: No  . Sexual activity: Yes  Lifestyle  . Physical activity:    Days per week: Not on file    Minutes per session: Not on file  . Stress: Not on file  Relationships  . Social connections:    Talks on phone: Not on file    Gets together: Not on file    Attends religious service: Not on file    Active member of club or organization: Not on file  Attends meetings of clubs or organizations: Not on file    Relationship status: Not on file  . Intimate partner violence:    Fear of current or ex partner: Not on file    Emotionally abused: Not on file    Physically abused: Not on file    Forced sexual activity: Not on file  Other Topics Concern  . Not on file  Social History Narrative  . Not on file      Review of Systems  Constitutional: Negative for chills, fatigue and unexpected weight change.  HENT: Negative for congestion, rhinorrhea, sneezing and sore throat.   Eyes: Negative for photophobia, pain and redness.  Respiratory: Negative for cough, chest tightness  and shortness of breath.   Cardiovascular: Negative for chest pain and palpitations.  Gastrointestinal: Negative for abdominal pain, constipation, diarrhea, nausea and vomiting.  Endocrine: Negative.   Genitourinary: Negative for dysuria and frequency.  Musculoskeletal: Negative for arthralgias, back pain, joint swelling and neck pain.  Skin: Negative for rash.  Allergic/Immunologic: Negative.   Neurological: Negative for tremors and numbness.  Hematological: Negative for adenopathy. Does not bruise/bleed easily.  Psychiatric/Behavioral: Negative for behavioral problems and sleep disturbance. The patient is not nervous/anxious.     Vital Signs: BP 112/82 (BP Location: Left Arm, Patient Position: Sitting, Cuff Size: Normal)   Pulse 69   Resp 16   Ht 5\' 6"  (1.676 m)   Wt 155 lb (70.3 kg)   SpO2 99%   BMI 25.02 kg/m    Physical Exam  Constitutional: She is oriented to person, place, and time. She appears well-developed and well-nourished. No distress.  HENT:  Head: Normocephalic and atraumatic.  Mouth/Throat: Oropharynx is clear and moist. No oropharyngeal exudate.  Eyes: Pupils are equal, round, and reactive to light. EOM are normal.  Neck: Normal range of motion. Neck supple. No JVD present. No tracheal deviation present. No thyromegaly present.  Cardiovascular: Normal rate, regular rhythm and normal heart sounds. Exam reveals no gallop and no friction rub.  No murmur heard. Pulmonary/Chest: Effort normal and breath sounds normal. No respiratory distress. She has no wheezes. She has no rales. She exhibits no tenderness.  Abdominal: Soft. There is no tenderness. There is no guarding.  Musculoskeletal: Normal range of motion.  Lymphadenopathy:    She has no cervical adenopathy.  Neurological: She is alert and oriented to person, place, and time. No cranial nerve deficit.  Skin: Skin is warm and dry. She is not diaphoretic.  Psychiatric: She has a normal mood and affect. Her  behavior is normal. Judgment and thought content normal.  Nursing note and vitals reviewed.  Assessment/Plan: 1. Encounter for management and injection of injectable progestin contraceptive Patient pregnancy test was performed and new injection for Depo-Provera was provided in office today.  Patient does not need any refills of this medication at this time.  And we will see her in 3 months for her next injection.  Instructed patient to call office if she had any questions, comments, or concerns. - POCT urine pregnancy - medroxyPROGESTERone (DEPO-PROVERA) injection 150 mg  2. Eczema, unspecified type Patient's eczema is currently controlled using Kenalog cream.  She denies any need at this time.  General Counseling: Yvonne CastleValentina verbalizes understanding of the findings of todays visit and agrees with plan of treatment. I have discussed any further diagnostic evaluation that may be needed or ordered today. We also reviewed her medications today. she has been encouraged to call the office with any questions or concerns that  should arise related to todays visit.    Orders Placed This Encounter  Procedures  . POCT urine pregnancy    Meds ordered this encounter  Medications  . medroxyPROGESTERone (DEPO-PROVERA) injection 150 mg    Time spent: 25 Minutes   This patient was seen by Blima Ledger AGNP-C in Collaboration with Dr Lyndon Code as a part of collaborative care agreement     Johnna Acosta AGNP-C Internal medicine

## 2018-04-04 NOTE — Patient Instructions (Signed)

## 2018-06-12 ENCOUNTER — Ambulatory Visit (INDEPENDENT_AMBULATORY_CARE_PROVIDER_SITE_OTHER): Admitting: Adult Health

## 2018-06-12 ENCOUNTER — Encounter: Payer: Self-pay | Admitting: Adult Health

## 2018-06-12 VITALS — BP 102/66 | HR 91 | Temp 98.9°F | Resp 16 | Ht 66.0 in | Wt 149.0 lb

## 2018-06-12 DIAGNOSIS — J111 Influenza due to unidentified influenza virus with other respiratory manifestations: Secondary | ICD-10-CM | POA: Diagnosis not present

## 2018-06-12 DIAGNOSIS — J029 Acute pharyngitis, unspecified: Secondary | ICD-10-CM | POA: Diagnosis not present

## 2018-06-12 LAB — POCT INFLUENZA A/B
INFLUENZA A, POC: NEGATIVE
INFLUENZA B, POC: POSITIVE — AB

## 2018-06-12 NOTE — Patient Instructions (Signed)

## 2018-06-12 NOTE — Progress Notes (Signed)
[facility] [address] [place], [place] [address]  Internal MEDICINE  Office Visit Note  Patient Name: Yvonne Christensen  [id number]  [id number]  Date of Service: 06/20/2018  Chief Complaint  Patient presents with  . Headache    started friday   . Generalized Body Aches  . Sore Throat  . Cough     HPI Pt is here for a sick visit.  She reports 1 week of headache, sore throat, cough and generalized body aches.  He denies any sick contacts however she does not appear to be getting better over the last couple days.  She is positive for influenza B today in the office.     Current Medication:  Outpatient Encounter Medications as of 06/12/2018  Medication Sig  . medroxyPROGESTERone (DEPO-PROVERA) 150 MG/ML injection Inject 1 mL (150 mg total) into the muscle every 3 (three) months.  . Multiple Vitamin (MULTIVITAMIN) tablet Take 1 tablet by mouth daily. Prenatal vitamin  . triamcinolone cream (KENALOG) 0.1 % Apply 1 application topically 2 (two) times daily. Apply to affected area twice daily for up to 2 weeks for eczema   No facility-administered encounter medications on file as of 06/12/2018.       Medical History: Past Medical History:  Diagnosis Date  . Bacterial vaginosis 03/2016   being treated, dosnot remember med name     Vital Signs: BP 102/66   Pulse 91   Temp 98.9 F (37.2 C) (Oral)   Resp 16   Ht 5\' 6"  (1.676 m)   Wt 149 lb (67.6 kg)   SpO2 96%   BMI 24.05 kg/m    Review of Systems  Constitutional: Negative for chills, fatigue and unexpected weight change.  HENT: Negative for congestion, rhinorrhea, sneezing and sore throat.   Eyes: Negative for photophobia, pain and redness.  Respiratory: Negative for cough, chest tightness and shortness of breath.   Cardiovascular: Negative for chest pain and palpitations.  Gastrointestinal: Negative for abdominal pain, constipation, diarrhea, nausea and vomiting.  Endocrine: Negative.    Genitourinary: Negative for dysuria and frequency.  Musculoskeletal: Negative for arthralgias, back pain, joint swelling and neck pain.  Skin: Negative for rash.  Allergic/Immunologic: Negative.   Neurological: Negative for tremors and numbness.  Hematological: Negative for adenopathy. Does not bruise/bleed easily.  Psychiatric/Behavioral: Negative for behavioral problems and sleep disturbance. The patient is not nervous/anxious.     Physical Exam Vitals signs and nursing note reviewed.  Constitutional:      General: She is not in acute distress.    Appearance: She is well-developed. She is not diaphoretic.  HENT:     Head: Normocephalic and atraumatic.     Mouth/Throat:     Pharynx: No oropharyngeal exudate.  Eyes:     Pupils: Pupils are equal, round, and reactive to light.  Neck:     Musculoskeletal: Normal range of motion and neck supple.     Thyroid: No thyromegaly.     Vascular: No JVD.     Trachea: No tracheal deviation.  Cardiovascular:     Rate and Rhythm: Normal rate and regular rhythm.     Heart sounds: Normal heart sounds. No murmur. No friction rub. No gallop.   Pulmonary:     Effort: Pulmonary effort is normal. No respiratory distress.     Breath sounds: Normal breath sounds. No wheezing or rales.  Chest:     Chest wall: No tenderness.  Abdominal:     Palpations: Abdomen is soft.  Tenderness: There is no abdominal tenderness. There is no guarding.  Musculoskeletal: Normal range of motion.  Lymphadenopathy:     Cervical: No cervical adenopathy.  Skin:    General: Skin is warm and dry.  Neurological:     Mental Status: She is alert and oriented to person, place, and time.     Cranial Nerves: No cranial nerve deficit.  Psychiatric:        Behavior: Behavior normal.        Thought Content: Thought content normal.        Judgment: Judgment normal.    Assessment/Plan: 1. Influenza Patient positive for influenza B symptoms have persisted for more than 72  hours so patient is advised to rest, drink any of fluids and treat symptoms with OTC medications.  Informed patient to return to clinic if symptoms fail to improve.  2. Sore throat Influenza B positive - POCT Influenza A/B  General Counseling: Yvonne Christensen verbalizes understanding of the findings of todays visit and agrees with plan of treatment. I have discussed any further diagnostic evaluation that may be needed or ordered today. We also reviewed her medications today. she has been encouraged to call the office with any questions or concerns that should arise related to todays visit.   Orders Placed This Encounter  Procedures  . POCT Influenza A/B    No orders of the defined types were placed in this encounter.   Time spent: 25 Minutes  This patient was seen by Yvonne Christensen AGNP-C in Collaboration with Dr Yvonne Christensen as a part of collaborative care agreement.  Yvonne Christensen AGNP-C Internal Medicine

## 2018-07-04 ENCOUNTER — Encounter: Payer: Self-pay | Admitting: Adult Health

## 2018-07-04 ENCOUNTER — Ambulatory Visit (INDEPENDENT_AMBULATORY_CARE_PROVIDER_SITE_OTHER): Admitting: Adult Health

## 2018-07-04 VITALS — BP 102/68 | HR 73 | Resp 16 | Ht 66.0 in | Wt 152.0 lb

## 2018-07-04 DIAGNOSIS — L309 Dermatitis, unspecified: Secondary | ICD-10-CM

## 2018-07-04 DIAGNOSIS — Z3202 Encounter for pregnancy test, result negative: Secondary | ICD-10-CM | POA: Diagnosis not present

## 2018-07-04 DIAGNOSIS — Z3042 Encounter for surveillance of injectable contraceptive: Secondary | ICD-10-CM | POA: Diagnosis not present

## 2018-07-04 LAB — POCT URINE PREGNANCY: PREG TEST UR: NEGATIVE

## 2018-07-04 MED ORDER — MEDROXYPROGESTERONE ACETATE 150 MG/ML IM SUSP
150.0000 mg | Freq: Once | INTRAMUSCULAR | Status: AC
  Administered 2018-07-04: 150 mg via INTRAMUSCULAR

## 2018-07-04 NOTE — Progress Notes (Signed)
[facility] [address] [place], [place] [id number]  Internal MEDICINE  Office Visit Note  Patient Name: Yvonne AdenValentina T Christensen  [birth year]  [id number]  Date of Service: 07/04/2018  Chief Complaint  Patient presents with  . Medical Management of Chronic Issues    3 month birth control    HPI  Patient is here for follow-up on contraception.  She been taking Depo-Provera for over 2 years.  She denies any significant weight gain.  She denies any history of migraines with aura, blood clots, or effective cancers.  She is report occasional few days of spotty menstrual bleeding.  She continue be satisfied with her results and the convenience of her Depo-Provera, she would like to continue at this time.   Current Medication: Outpatient Encounter Medications as of 07/04/2018  Medication Sig  . medroxyPROGESTERone (DEPO-PROVERA) 150 MG/ML injection Inject 1 mL (150 mg total) into the muscle every 3 (three) months.  . Multiple Vitamin (MULTIVITAMIN) tablet Take 1 tablet by mouth daily. Prenatal vitamin  . triamcinolone cream (KENALOG) 0.1 % Apply 1 application topically 2 (two) times daily. Apply to affected area twice daily for up to 2 weeks for eczema  . [EXPIRED] medroxyPROGESTERone (DEPO-PROVERA) injection 150 mg    No facility-administered encounter medications on file as of 07/04/2018.     Surgical History: Past Surgical History:  Procedure Laterality Date  . CESAREAN SECTION  05/12/2016  . WISDOM TOOTH EXTRACTION  2013   g/a    Medical History: Past Medical History:  Diagnosis Date  . Bacterial vaginosis 03/2016   being treated, dosnot remember med name    Family History: Family History  Problem Relation Age of Onset  . Hypertension Maternal Grandmother   . Cancer Maternal Grandfather   . Healthy Mother   . Cirrhosis Father     Social History   Socioeconomic History  . Marital status: Single    Spouse name: Not on file  . Number of children: Not on file  .  Years of education: Not on file  . Highest education level: Not on file  Occupational History  . Not on file  Social Needs  . Financial resource strain: Not on file  . Food insecurity:    Worry: Not on file    Inability: Not on file  . Transportation needs:    Medical: Not on file    Non-medical: Not on file  Tobacco Use  . Smoking status: Never Smoker  . Smokeless tobacco: Never Used  Substance and Sexual Activity  . Alcohol use: Not Currently  . Drug use: No  . Sexual activity: Yes  Lifestyle  . Physical activity:    Days per week: Not on file    Minutes per session: Not on file  . Stress: Not on file  Relationships  . Social connections:    Talks on phone: Not on file    Gets together: Not on file    Attends religious service: Not on file    Active member of club or organization: Not on file    Attends meetings of clubs or organizations: Not on file    Relationship status: Not on file  . Intimate partner violence:    Fear of current or ex partner: Not on file    Emotionally abused: Not on file    Physically abused: Not on file    Forced sexual activity: Not on file  Other Topics Concern  . Not on file  Social History Narrative  .  Not on file      Review of Systems  Constitutional: Negative for chills, fatigue and unexpected weight change.  HENT: Negative for congestion, rhinorrhea, sneezing and sore throat.   Eyes: Negative for photophobia, pain and redness.  Respiratory: Negative for cough, chest tightness and shortness of breath.   Cardiovascular: Negative for chest pain and palpitations.  Gastrointestinal: Negative for abdominal pain, constipation, diarrhea, nausea and vomiting.  Endocrine: Negative.   Genitourinary: Negative for dysuria and frequency.  Musculoskeletal: Negative for arthralgias, back pain, joint swelling and neck pain.  Skin: Negative for rash.  Allergic/Immunologic: Negative.   Neurological: Negative for tremors and numbness.   Hematological: Negative for adenopathy. Does not bruise/bleed easily.  Psychiatric/Behavioral: Negative for behavioral problems and sleep disturbance. The patient is not nervous/anxious.     Vital Signs: BP 102/68   Pulse 73   Resp 16   Ht 5\' 6"  (1.676 m)   Wt 152 lb (68.9 kg)   SpO2 98%   BMI 24.53 kg/m    Physical Exam Vitals signs and nursing note reviewed.  Constitutional:      General: She is not in acute distress.    Appearance: She is well-developed. She is not diaphoretic.  HENT:     Head: Normocephalic and atraumatic.     Mouth/Throat:     Pharynx: No oropharyngeal exudate.  Eyes:     Pupils: Pupils are equal, round, and reactive to light.  Neck:     Musculoskeletal: Normal range of motion and neck supple.     Thyroid: No thyromegaly.     Vascular: No JVD.     Trachea: No tracheal deviation.  Cardiovascular:     Rate and Rhythm: Normal rate and regular rhythm.     Heart sounds: Normal heart sounds. No murmur. No friction rub. No gallop.   Pulmonary:     Effort: Pulmonary effort is normal. No respiratory distress.     Breath sounds: Normal breath sounds. No wheezing or rales.  Chest:     Chest wall: No tenderness.  Abdominal:     Palpations: Abdomen is soft.     Tenderness: There is no abdominal tenderness. There is no guarding.  Musculoskeletal: Normal range of motion.  Lymphadenopathy:     Cervical: No cervical adenopathy.  Skin:    General: Skin is warm and dry.  Neurological:     Mental Status: She is alert and oriented to person, place, and time.     Cranial Nerves: No cranial nerve deficit.  Psychiatric:        Behavior: Behavior normal.        Thought Content: Thought content normal.        Judgment: Judgment normal.    Assessment/Plan: 1. Encounter for management and injection of injectable progestin contraceptive Pt continues to deny side effects of medication.  She reports having a 3-4 day extremely light period every other month.    2.  Eczema, unspecified type Stable, well controlled at this time.   3. Pregnancy examination or test, negative result UPreg is negative at todays visit.  - POCT urine pregnancy  4. Encounter for Depo-Provera contraception Pt given Depo injection.  - medroxyPROGESTERone (DEPO-PROVERA) injection 150 mg  General Counseling: Yvonne Christensen verbalizes understanding of the findings of todays visit and agrees with plan of treatment. I have discussed any further diagnostic evaluation that may be needed or ordered today. We also reviewed her medications today. she has been encouraged to call the office with any questions or  concerns that should arise related to todays visit.    Orders Placed This Encounter  Procedures  . POCT urine pregnancy    Meds ordered this encounter  Medications  . medroxyPROGESTERone (DEPO-PROVERA) injection 150 mg    Time spent: 20 Minutes   This patient was seen by Blima Ledger AGNP-C in Collaboration with Dr Lyndon Code as a part of collaborative care agreement     Johnna Acosta AGNP-C Internal medicine

## 2018-07-04 NOTE — Patient Instructions (Signed)

## 2018-10-03 ENCOUNTER — Ambulatory Visit: Admitting: Nurse Practitioner

## 2018-10-10 ENCOUNTER — Encounter: Payer: Self-pay | Admitting: Adult Health

## 2018-10-10 ENCOUNTER — Ambulatory Visit (INDEPENDENT_AMBULATORY_CARE_PROVIDER_SITE_OTHER): Admitting: Adult Health

## 2018-10-10 ENCOUNTER — Other Ambulatory Visit: Payer: Self-pay

## 2018-10-10 VITALS — BP 102/68 | HR 73 | Resp 16 | Ht 66.0 in | Wt 156.0 lb

## 2018-10-10 DIAGNOSIS — Z3202 Encounter for pregnancy test, result negative: Secondary | ICD-10-CM

## 2018-10-10 DIAGNOSIS — Z3042 Encounter for surveillance of injectable contraceptive: Secondary | ICD-10-CM | POA: Diagnosis not present

## 2018-10-10 LAB — POCT URINE PREGNANCY: Preg Test, Ur: NEGATIVE

## 2018-10-10 MED ORDER — MEDROXYPROGESTERONE ACETATE 150 MG/ML IM SUSP
150.0000 mg | Freq: Once | INTRAMUSCULAR | Status: AC
  Administered 2018-10-10: 150 mg via INTRAMUSCULAR

## 2018-10-10 NOTE — Progress Notes (Signed)
[facility] [address] [place], [place] [address]  Internal MEDICINE  Office Visit Note  Patient Name: Yvonne Christensen  [id number]  [id number]  Date of Service: 10/10/2018  Chief Complaint  Patient presents with  . Medical Management of Chronic Issues    depo injection     HPI  Pt here for follow up for contraception.  She is currently not pregnant.  She reports having some light spotting for 1-2 days each month while on depo.  She denies chest pain, SOB or blurred vision.  She does report intermittent headaches, but they are manageable.      Current Medication: Outpatient Encounter Medications as of 10/10/2018  Medication Sig  . medroxyPROGESTERone (DEPO-PROVERA) 150 MG/ML injection Inject 1 mL (150 mg total) into the muscle every 3 (three) months.  . Multiple Vitamin (MULTIVITAMIN) tablet Take 1 tablet by mouth daily. Prenatal vitamin  . triamcinolone cream (KENALOG) 0.1 % Apply 1 application topically 2 (two) times daily. Apply to affected area twice daily for up to 2 weeks for eczema   Facility-Administered Encounter Medications as of 10/10/2018  Medication  . medroxyPROGESTERone (DEPO-PROVERA) injection 150 mg    Surgical History: Past Surgical History:  Procedure Laterality Date  . CESAREAN SECTION  05/12/2016  . WISDOM TOOTH EXTRACTION  2013   g/a    Medical History: Past Medical History:  Diagnosis Date  . Bacterial vaginosis 03/2016   being treated, dosnot remember med name    Family History: Family History  Problem Relation Age of Onset  . Hypertension Maternal Grandmother   . Cancer Maternal Grandfather   . Healthy Mother   . Cirrhosis Father     Social History   Socioeconomic History  . Marital status: Single    Spouse name: Not on file  . Number of children: Not on file  . Years of education: Not on file  . Highest education level: Not on file  Occupational History  . Not on file  Social Needs  . Financial resource strain:  Not on file  . Food insecurity:    Worry: Not on file    Inability: Not on file  . Transportation needs:    Medical: Not on file    Non-medical: Not on file  Tobacco Use  . Smoking status: Never Smoker  . Smokeless tobacco: Never Used  Substance and Sexual Activity  . Alcohol use: Not Currently  . Drug use: No  . Sexual activity: Yes  Lifestyle  . Physical activity:    Days per week: Not on file    Minutes per session: Not on file  . Stress: Not on file  Relationships  . Social connections:    Talks on phone: Not on file    Gets together: Not on file    Attends religious service: Not on file    Active member of club or organization: Not on file    Attends meetings of clubs or organizations: Not on file    Relationship status: Not on file  . Intimate partner violence:    Fear of current or ex partner: Not on file    Emotionally abused: Not on file    Physically abused: Not on file    Forced sexual activity: Not on file  Other Topics Concern  . Not on file  Social History Narrative  . Not on file      Review of Systems  Constitutional: Negative for chills, fatigue and unexpected weight change.  HENT: Negative for  congestion, rhinorrhea, sneezing and sore throat.   Eyes: Negative for photophobia, pain and redness.  Respiratory: Negative for cough, chest tightness and shortness of breath.   Cardiovascular: Negative for chest pain and palpitations.  Gastrointestinal: Negative for abdominal pain, constipation, diarrhea, nausea and vomiting.  Endocrine: Negative.   Genitourinary: Negative for dysuria and frequency.  Musculoskeletal: Negative for arthralgias, back pain, joint swelling and neck pain.  Skin: Negative for rash.  Allergic/Immunologic: Negative.   Neurological: Positive for headaches. Negative for tremors and numbness.  Hematological: Negative for adenopathy. Does not bruise/bleed easily.  Psychiatric/Behavioral: Negative for behavioral problems and sleep  disturbance. The patient is not nervous/anxious.     Vital Signs: BP 102/68   Pulse 73   Resp 16   Ht 5\' 6"  (1.676 m)   Wt 156 lb (70.8 kg)   SpO2 99%   BMI 25.18 kg/m    Physical Exam Vitals signs and nursing note reviewed.  Constitutional:      General: She is not in acute distress.    Appearance: She is well-developed. She is not diaphoretic.  HENT:     Head: Normocephalic and atraumatic.     Mouth/Throat:     Pharynx: No oropharyngeal exudate.  Eyes:     Pupils: Pupils are equal, round, and reactive to light.  Neck:     Musculoskeletal: Normal range of motion and neck supple.     Thyroid: No thyromegaly.     Vascular: No JVD.     Trachea: No tracheal deviation.  Cardiovascular:     Rate and Rhythm: Normal rate and regular rhythm.     Heart sounds: Normal heart sounds. No murmur. No friction rub. No gallop.   Pulmonary:     Effort: Pulmonary effort is normal. No respiratory distress.     Breath sounds: Normal breath sounds. No wheezing or rales.  Chest:     Chest wall: No tenderness.  Abdominal:     Palpations: Abdomen is soft.     Tenderness: There is no abdominal tenderness. There is no guarding.  Musculoskeletal: Normal range of motion.  Lymphadenopathy:     Cervical: No cervical adenopathy.  Skin:    General: Skin is warm and dry.  Neurological:     Mental Status: She is alert and oriented to person, place, and time.     Cranial Nerves: No cranial nerve deficit.  Psychiatric:        Behavior: Behavior normal.        Thought Content: Thought content normal.        Judgment: Judgment normal.    Assessment/Plan: 1. Encounter for management and injection of injectable progestin contraceptive Given Depo injection today.  2. On Depo-Provera for contraception Depo injection today.   Follow up in 3 months for Pregnancy test and next dose.  - medroxyPROGESTERone (DEPO-PROVERA) injection 150 mg  3. Pregnancy examination or test, negative result Negative  pregnancy today.  - POCT urine pregnancy  General Counseling: Yvonne Christensen verbalizes understanding of the findings of todays visit and agrees with plan of treatment. I have discussed any further diagnostic evaluation that may be needed or ordered today. We also reviewed her medications today. she has been encouraged to call the office with any questions or concerns that should arise related to todays visit.    Orders Placed This Encounter  Procedures  . POCT urine pregnancy    Meds ordered this encounter  Medications  . medroxyPROGESTERone (DEPO-PROVERA) injection 150 mg    Time spent: 20  Minutes   This patient was seen by Orson Gear AGNP-C in Collaboration with Dr Lavera Guise as a part of collaborative care agreement     Kendell Bane AGNP-C Internal medicine

## 2018-10-10 NOTE — Patient Instructions (Signed)

## 2019-01-07 ENCOUNTER — Encounter: Payer: Self-pay | Admitting: Adult Health

## 2019-01-07 ENCOUNTER — Ambulatory Visit (INDEPENDENT_AMBULATORY_CARE_PROVIDER_SITE_OTHER): Admitting: Adult Health

## 2019-01-07 ENCOUNTER — Other Ambulatory Visit: Payer: Self-pay

## 2019-01-07 VITALS — BP 104/71 | HR 75 | Resp 16 | Ht 66.0 in | Wt 151.8 lb

## 2019-01-07 DIAGNOSIS — M79609 Pain in unspecified limb: Secondary | ICD-10-CM

## 2019-01-07 DIAGNOSIS — R202 Paresthesia of skin: Secondary | ICD-10-CM

## 2019-01-07 DIAGNOSIS — Z3042 Encounter for surveillance of injectable contraceptive: Secondary | ICD-10-CM | POA: Diagnosis not present

## 2019-01-07 NOTE — Progress Notes (Signed)
[facility] [address] [place], [place] [address]  Internal MEDICINE  Office Visit Note  Patient Name: Yvonne Christensen  [id number] 10, 1997  [id number]  Date of Service: 01/07/2019  Chief Complaint  Patient presents with  . Follow-up    mother was just dx with breast cancer, wants to know any preventative measures she sould take  . Pain    hand pain: happens when she picks things up, leg pain: numb and tingling, whether she is sitting or standing    HPI  Pt is here to to discuss breast CA screening.  She reports her mother was recently diagnosed with breast CA, and has a mastectomy scheduled for sept 9th.  She would like to discuss genetic testing since she also has a daughter.  She is also complaining of her left leg and arm paresthesia that lasts about 2-3 minutes.  She reports it is often when she is sitting in the evening, or when she first wakes up in the morning. She is in need of her deop injection, however the pharmacy did not have any, so she must wait till Tuesday.       Current Medication: Outpatient Encounter Medications as of 01/07/2019  Medication Sig  . medroxyPROGESTERone (DEPO-PROVERA) 150 MG/ML injection Inject 1 mL (150 mg total) into the muscle every 3 (three) months.  . Multiple Vitamin (MULTIVITAMIN) tablet Take 1 tablet by mouth daily. Prenatal vitamin  . triamcinolone cream (KENALOG) 0.1 % Apply 1 application topically 2 (two) times daily. Apply to affected area twice daily for up to 2 weeks for eczema   No facility-administered encounter medications on file as of 01/07/2019.     Surgical History: Past Surgical History:  Procedure Laterality Date  . CESAREAN SECTION  05/12/2016  . WISDOM TOOTH EXTRACTION  2013   g/a    Medical History: Past Medical History:  Diagnosis Date  . Bacterial vaginosis 03/2016   being treated, dosnot remember med name    Family History: Family History  Problem Relation Age of Onset  . Hypertension Maternal  Grandmother   . Cancer Maternal Grandfather   . Healthy Mother   . Cirrhosis Father     Social History   Socioeconomic History  . Marital status: Single    Spouse name: Not on file  . Number of children: Not on file  . Years of education: Not on file  . Highest education level: Not on file  Occupational History  . Not on file  Social Needs  . Financial resource strain: Not on file  . Food insecurity    Worry: Not on file    Inability: Not on file  . Transportation needs    Medical: Not on file    Non-medical: Not on file  Tobacco Use  . Smoking status: Never Smoker  . Smokeless tobacco: Never Used  Substance and Sexual Activity  . Alcohol use: Not Currently  . Drug use: No  . Sexual activity: Yes  Lifestyle  . Physical activity    Days per week: Not on file    Minutes per session: Not on file  . Stress: Not on file  Relationships  . Social Musicianconnections    Talks on phone: Not on file    Gets together: Not on file    Attends religious service: Not on file    Active member of club or organization: Not on file    Attends meetings of clubs or organizations: Not on file    Relationship status:  Not on file  . Intimate partner violence    Fear of current or ex partner: Not on file    Emotionally abused: Not on file    Physically abused: Not on file    Forced sexual activity: Not on file  Other Topics Concern  . Not on file  Social History Narrative  . Not on file      Review of Systems  Constitutional: Negative for chills, fatigue and unexpected weight change.  HENT: Negative for congestion, rhinorrhea, sneezing and sore throat.   Eyes: Negative for photophobia, pain and redness.  Respiratory: Negative for cough, chest tightness and shortness of breath.   Cardiovascular: Negative for chest pain and palpitations.  Gastrointestinal: Negative for abdominal pain, constipation, diarrhea, nausea and vomiting.  Endocrine: Negative.   Genitourinary: Negative for  dysuria and frequency.  Musculoskeletal: Negative for arthralgias, back pain, joint swelling and neck pain.  Skin: Negative for rash.  Allergic/Immunologic: Negative.   Neurological: Negative for tremors and numbness.  Hematological: Negative for adenopathy. Does not bruise/bleed easily.  Psychiatric/Behavioral: Negative for behavioral problems and sleep disturbance. The patient is not nervous/anxious.     Vital Signs: BP 104/71   Pulse 75   Resp 16   Ht 5\' 6"  (1.676 m)   Wt 151 lb 12.8 oz (68.9 kg)   SpO2 97%   BMI 24.50 kg/m    Physical Exam Vitals signs and nursing note reviewed.  Constitutional:      General: She is not in acute distress.    Appearance: She is well-developed. She is not diaphoretic.  HENT:     Head: Normocephalic and atraumatic.     Mouth/Throat:     Pharynx: No oropharyngeal exudate.  Eyes:     Pupils: Pupils are equal, round, and reactive to light.  Neck:     Musculoskeletal: Normal range of motion and neck supple.     Thyroid: No thyromegaly.     Vascular: No JVD.     Trachea: No tracheal deviation.  Cardiovascular:     Rate and Rhythm: Normal rate and regular rhythm.     Heart sounds: Normal heart sounds. No murmur. No friction rub. No gallop.   Pulmonary:     Effort: Pulmonary effort is normal. No respiratory distress.     Breath sounds: Normal breath sounds. No wheezing or rales.  Chest:     Chest wall: No tenderness.  Abdominal:     Palpations: Abdomen is soft.     Tenderness: There is no abdominal tenderness. There is no guarding.  Musculoskeletal: Normal range of motion.  Lymphadenopathy:     Cervical: No cervical adenopathy.  Skin:    General: Skin is warm and dry.  Neurological:     Mental Status: She is alert and oriented to person, place, and time.     Cranial Nerves: No cranial nerve deficit.  Psychiatric:        Behavior: Behavior normal.        Thought Content: Thought content normal.        Judgment: Judgment normal.      Assessment/Plan: 1. On Depo-Provera for contraception Pt will come by tomorrow when she has her shot, and we will update her depo.   2. Paresthesia and pain of extremity We discussed follow up with ortho, which she declined at this time.  We also discussed some exercises, and possibly prednisone for inflammation.  She decided to wait, and try a brace for her wrist and observe more the  times that cause this for her.   General Counseling: Terie verbalizes understanding of the findings of todays visit and agrees with plan of treatment. I have discussed any further diagnostic evaluation that may be needed or ordered today. We also reviewed her medications today. she has been encouraged to call the office with any questions or concerns that should arise related to todays visit.    No orders of the defined types were placed in this encounter.   No orders of the defined types were placed in this encounter.   Time spent: 15 Minutes   This patient was seen by Orson Gear AGNP-C in Collaboration with Dr Lavera Guise as a part of collaborative care agreement     Kendell Bane AGNP-C Internal medicine

## 2019-01-22 ENCOUNTER — Other Ambulatory Visit: Payer: Self-pay

## 2019-01-22 MED ORDER — TRIAMCINOLONE ACETONIDE 0.1 % EX CREA: 1.0000 "application " | TOPICAL_CREAM | Freq: Two times a day (BID) | CUTANEOUS | 3 refills | Status: DC

## 2019-01-24 ENCOUNTER — Ambulatory Visit: Payer: Self-pay | Admitting: Adult Health

## 2019-03-25 ENCOUNTER — Other Ambulatory Visit: Payer: Self-pay

## 2019-03-25 ENCOUNTER — Encounter: Payer: Self-pay | Admitting: Adult Health

## 2019-03-25 ENCOUNTER — Ambulatory Visit (INDEPENDENT_AMBULATORY_CARE_PROVIDER_SITE_OTHER): Admitting: Adult Health

## 2019-03-25 VITALS — BP 120/70 | HR 81 | Temp 97.6°F | Resp 16 | Ht 66.0 in | Wt 159.0 lb

## 2019-03-25 DIAGNOSIS — B3731 Acute candidiasis of vulva and vagina: Secondary | ICD-10-CM

## 2019-03-25 DIAGNOSIS — Z202 Contact with and (suspected) exposure to infections with a predominantly sexual mode of transmission: Secondary | ICD-10-CM

## 2019-03-25 DIAGNOSIS — N760 Acute vaginitis: Secondary | ICD-10-CM

## 2019-03-25 DIAGNOSIS — B379 Candidiasis, unspecified: Secondary | ICD-10-CM | POA: Diagnosis not present

## 2019-03-25 DIAGNOSIS — N898 Other specified noninflammatory disorders of vagina: Secondary | ICD-10-CM

## 2019-03-25 DIAGNOSIS — B373 Candidiasis of vulva and vagina: Secondary | ICD-10-CM

## 2019-03-25 DIAGNOSIS — R300 Dysuria: Secondary | ICD-10-CM

## 2019-03-25 LAB — POCT GLYCOSYLATED HEMOGLOBIN (HGB A1C): Hemoglobin A1C: 5.2 % (ref 4.0–5.6)

## 2019-03-25 LAB — POCT URINALYSIS DIPSTICK
Bilirubin, UA: NEGATIVE
Blood, UA: NEGATIVE
Glucose, UA: NEGATIVE
Ketones, UA: NEGATIVE
Leukocytes, UA: NEGATIVE
Nitrite, UA: NEGATIVE
Protein, UA: NEGATIVE
Spec Grav, UA: 1.01 (ref 1.010–1.025)
Urobilinogen, UA: 0.2 E.U./dL
pH, UA: 7.5 (ref 5.0–8.0)

## 2019-03-25 MED ORDER — FLUCONAZOLE 150 MG PO TABS: 150.0000 mg | ORAL_TABLET | ORAL | 2 refills | Status: DC

## 2019-03-25 NOTE — Progress Notes (Signed)
[facility] [place], [place] [address]  Internal MEDICINE  Office Visit Note  Patient Name: Yvonne Christensen  [id number]  [id number]  Date of Service: 03/25/2019  Chief Complaint  Patient presents with  . Vaginitis  . Anxiety    HPI  Patient is here today for complaints of possible yeast infection. Has had recurrent symptoms of yeast infections on and off for 2 years and has been treated with diflucan. In the past diflucan resolves symptoms but they seem to return in a month or so. In the last two months symptoms have seemed to worsen with increased vaginal discharge and itching. Has tried changing her soap to [place] for sensitive skin and reports sexual intercourse with one partner and has had the same partner for several years. Urinalysis was negative today. Discussed chronic yeast infections can be from high blood sugar levels, discussed checking A1C. STD testing had not been done since 2019, discussed sending urine off for STD testing. Wet mount swab had not been done recently, discussed swabbing vagina for discharge for wet mount testing. Discussed with her about not using soap around or inside of vagina and only using warm water to clean the outside. Encouraged wearing cotton underwear only and avoiding restrictive clothing such as yoga pants on a regular basis. Also discussed eating a probiotic yogurt daily to promote healthy bacteria growth. Last depo injection was due in August but has not received injection and reports not having a menstrual cycle since not getting injection. Completed a home pregnancy test last week that was negative.   Current Medication: Outpatient Encounter Medications as of 03/25/2019  Medication Sig  . medroxyPROGESTERone (DEPO-PROVERA) 150 MG/ML injection Inject 1 mL (150 mg total) into the muscle every 3 (three) months.  . Multiple Vitamin (MULTIVITAMIN) tablet Take 1 tablet by mouth daily. Prenatal vitamin  . triamcinolone cream  (KENALOG) 0.1 % Apply 1 application topically 2 (two) times daily. Apply to affected area twice daily for up to 2 weeks for eczema   No facility-administered encounter medications on file as of 03/25/2019.     Surgical History: Past Surgical History:  Procedure Laterality Date  . CESAREAN SECTION  05/12/2016  . WISDOM TOOTH EXTRACTION  2013   g/a    Medical History: Past Medical History:  Diagnosis Date  . Bacterial vaginosis 03/2016   being treated, dosnot remember med name    Family History: Family History  Problem Relation Age of Onset  . Hypertension Maternal Grandmother   . Cancer Maternal Grandfather   . Healthy Mother   . Cirrhosis Father     Social History   Socioeconomic History  . Marital status: Single    Spouse name: Not on file  . Number of children: Not on file  . Years of education: Not on file  . Highest education level: Not on file  Occupational History  . Not on file  Social Needs  . Financial resource strain: Not on file  . Food insecurity    Worry: Not on file    Inability: Not on file  . Transportation needs    Medical: Not on file    Non-medical: Not on file  Tobacco Use  . Smoking status: Never Smoker  . Smokeless tobacco: Never Used  Substance and Sexual Activity  . Alcohol use: Not Currently  . Drug use: No  . Sexual activity: Yes  Lifestyle  . Physical activity    Days per week: Not on file  Minutes per session: Not on file  . Stress: Not on file  Relationships  . Social Musicianconnections    Talks on phone: Not on file    Gets together: Not on file    Attends religious service: Not on file    Active member of club or organization: Not on file    Attends meetings of clubs or organizations: Not on file    Relationship status: Not on file  . Intimate partner violence    Fear of current or ex partner: Not on file    Emotionally abused: Not on file    Physically abused: Not on file    Forced sexual activity: Not on file  Other  Topics Concern  . Not on file  Social History Narrative  . Not on file    Review of Systems  Constitutional: Negative for chills, fatigue and unexpected weight change.  HENT: Negative for congestion, rhinorrhea, sneezing and sore throat.   Eyes: Negative for photophobia, pain and redness.  Respiratory: Negative for cough, chest tightness and shortness of breath.   Cardiovascular: Negative for chest pain and palpitations.  Gastrointestinal: Negative for abdominal pain, constipation, diarrhea, nausea and vomiting.  Endocrine: Negative.   Genitourinary: Positive for vaginal discharge. Negative for dysuria and frequency.       Vaginal itching and irritation  Musculoskeletal: Negative for arthralgias, back pain, joint swelling and neck pain.  Skin: Negative for rash.  Allergic/Immunologic: Negative.   Neurological: Negative for tremors and numbness.  Hematological: Negative for adenopathy. Does not bruise/bleed easily.  Psychiatric/Behavioral: Negative for behavioral problems and sleep disturbance. The patient is not nervous/anxious.     Vital Signs: BP 120/70   Pulse 81   Temp 97.6 F (36.4 C)   Resp 16   Ht 5\' 6"  (1.676 m)   Wt 159 lb (72.1 kg)   SpO2 99%   BMI 25.66 kg/m    Physical Exam Vitals signs and nursing note reviewed.  Constitutional:      General: She is not in acute distress.    Appearance: She is well-developed. She is not diaphoretic.  HENT:     Head: Normocephalic and atraumatic.     Mouth/Throat:     Pharynx: No oropharyngeal exudate.  Eyes:     Pupils: Pupils are equal, round, and reactive to light.  Neck:     Musculoskeletal: Normal range of motion and neck supple.     Thyroid: No thyromegaly.     Vascular: No JVD.     Trachea: No tracheal deviation.  Cardiovascular:     Rate and Rhythm: Normal rate and regular rhythm.     Heart sounds: Normal heart sounds. No murmur. No friction rub. No gallop.   Pulmonary:     Effort: Pulmonary effort is  normal. No respiratory distress.     Breath sounds: Normal breath sounds. No wheezing or rales.  Chest:     Chest wall: No tenderness.  Abdominal:     Palpations: Abdomen is soft.     Tenderness: There is no abdominal tenderness. There is no guarding.  Genitourinary:    Comments: Wet mount swab obtained by vaginal exam Sela HuaKrupa was in room as chaperone during vaginal exam  External and internal vagina was WNL, no redness or signs of irritation  Minimal white thick discharge present on cervix No cervical tenderness Musculoskeletal: Normal range of motion.  Lymphadenopathy:     Cervical: No cervical adenopathy.  Skin:    General: Skin is warm and dry.  Neurological:     Mental Status: She is alert and oriented to person, place, and time.     Cranial Nerves: No cranial nerve deficit.  Psychiatric:        Behavior: Behavior normal.        Thought Content: Thought content normal.        Judgment: Judgment normal.     Assessment/Plan: 1. Yeast infection Recurrent symptoms of vaginal discharge, itching and irritation. A1C 5.6, checked due to chronic symptoms of possible yeast infection. Treatment for symptomatic yeast infection. - POCT HgB A1C - fluconazole (DIFLUCAN) 150 MG tablet; Take 1 tablet (150 mg total) by mouth every 3 (three) days.  Dispense: 3 tablet; Refill: 2  2. Possible exposure to STD Discussed in great detail importance of practicing safe sexual practices to prevent STD exposure. - Chlamydia/Gonococcus/Trichomonas, NAA  3. Vaginal discharge Discussed in great detail the difference between normal vaginal discharge and abnormal vaginal discharge. Patient verbalized a greater understanding after discussion.  4. Dysuria - POCT Urinalysis Dipstick  General Counseling: Zurisadai verbalizes understanding of the findings of todays visit and agrees with plan of treatment. I have discussed any further diagnostic evaluation that may be needed or ordered today. We also  reviewed her medications today. she has been encouraged to call the office with any questions or concerns that should arise related to todays visit.    Orders Placed This Encounter  Procedures  . POCT Urinalysis Dipstick    No orders of the defined types were placed in this encounter.   Time spent: 30 Minutes   This patient was seen by Blima Ledger AGNP-C in Collaboration with Dr Lyndon Code as a part of collaborative care agreement     Johnna Acosta AGNP-C Internal medicine

## 2019-03-28 ENCOUNTER — Telehealth: Payer: Self-pay

## 2019-03-28 LAB — CHLAMYDIA/GONOCOCCUS/TRICHOMONAS, NAA
Chlamydia by NAA: NEGATIVE
Gonococcus by NAA: NEGATIVE
Trich vag by NAA: NEGATIVE

## 2019-03-28 NOTE — Telephone Encounter (Signed)
-----   Message from Kendell Bane, NP sent at 03/28/2019  8:41 AM EST ----- Negative for STI bacterial infections

## 2019-03-28 NOTE — Telephone Encounter (Signed)
lmov for patient to return call

## 2019-03-29 LAB — NUSWAB VG+, HSV
Candida albicans, NAA: NEGATIVE
Candida glabrata, NAA: NEGATIVE
Chlamydia trachomatis, NAA: NEGATIVE
HSV 1 NAA: NEGATIVE
HSV 2 NAA: NEGATIVE
Neisseria gonorrhoeae, NAA: NEGATIVE
Trich vag by NAA: NEGATIVE

## 2019-05-23 ENCOUNTER — Telehealth: Payer: Self-pay

## 2019-05-23 NOTE — Telephone Encounter (Signed)
Confirmed appointment with patient. klh °

## 2019-05-28 ENCOUNTER — Encounter: Payer: Self-pay | Admitting: Adult Health

## 2019-05-28 ENCOUNTER — Ambulatory Visit (INDEPENDENT_AMBULATORY_CARE_PROVIDER_SITE_OTHER): Admitting: Adult Health

## 2019-05-28 ENCOUNTER — Other Ambulatory Visit: Payer: Self-pay

## 2019-05-28 VITALS — BP 108/74 | HR 71 | Temp 97.6°F | Resp 16 | Ht 66.0 in | Wt 160.0 lb

## 2019-05-28 DIAGNOSIS — Z0001 Encounter for general adult medical examination with abnormal findings: Secondary | ICD-10-CM

## 2019-05-28 DIAGNOSIS — R5383 Other fatigue: Secondary | ICD-10-CM | POA: Diagnosis not present

## 2019-05-28 DIAGNOSIS — F411 Generalized anxiety disorder: Secondary | ICD-10-CM

## 2019-05-28 DIAGNOSIS — R300 Dysuria: Secondary | ICD-10-CM | POA: Diagnosis not present

## 2019-05-28 DIAGNOSIS — Z124 Encounter for screening for malignant neoplasm of cervix: Secondary | ICD-10-CM

## 2019-05-28 MED ORDER — ESCITALOPRAM OXALATE 10 MG PO TABS: 10.0000 mg | ORAL_TABLET | Freq: Every day | ORAL | 0 refills | Status: DC

## 2019-05-28 NOTE — Progress Notes (Signed)
[facility] [address] [place], [place] [address]  Internal MEDICINE  Office Visit Note  Patient Name: Yvonne Christensen  [id number]  [id number]  Date of Service: 05/28/2019  Chief Complaint  Patient presents with  . Annual Exam     HPI Pt is here for routine health maintenance examination. Overall, she is feeling well. She does report that she feels like an increase in her anxiety has been bothering her. Before COVID she was seeing a therapist regularly and this helped with her anxiety as she had someone to talk to about her issues. She discussed not having anyone know since she is not able to see her therapist due to the pandemic to talk to and she feels that her anxiety is building up. She is unable to talk to her family because being African American they view mental health as being "crazy" and she does not feel comfortable discussing her issues with them. She denies heart palpitations or chest pain, also denies suicidal ideation. Other than her anxiety she denies any other issues she feels she needs to discuss at this time. Currently not on any form of birth control as she wanted to take a break from hormones and is using condoms as protection when sexually active.  Current Medication: Outpatient Encounter Medications as of 05/28/2019  Medication Sig  . fluconazole (DIFLUCAN) 150 MG tablet Take 1 tablet (150 mg total) by mouth every 3 (three) days.  . Multiple Vitamin (MULTIVITAMIN) tablet Take 1 tablet by mouth daily. Prenatal vitamin  . triamcinolone cream (KENALOG) 0.1 % Apply 1 application topically 2 (two) times daily. Apply to affected area twice daily for up to 2 weeks for eczema  . [DISCONTINUED] medroxyPROGESTERone (DEPO-PROVERA) 150 MG/ML injection Inject 1 mL (150 mg total) into the muscle every 3 (three) months. (Patient not taking: Reported on 05/28/2019)   No facility-administered encounter medications on file as of 05/28/2019.    Surgical History: Past  Surgical History:  Procedure Laterality Date  . CESAREAN SECTION  05/12/2016  . WISDOM TOOTH EXTRACTION  2013   g/a    Medical History: Past Medical History:  Diagnosis Date  . Bacterial vaginosis 03/2016   being treated, dosnot remember med name    Family History: Family History  Problem Relation Age of Onset  . Hypertension Maternal Grandmother   . Cancer Maternal Grandfather   . Healthy Mother   . Cirrhosis Father     Review of Systems  Constitutional: Negative for chills, fatigue and unexpected weight change.  HENT: Negative for congestion, rhinorrhea, sneezing and sore throat.   Eyes: Negative for photophobia, pain and redness.  Respiratory: Negative for cough, chest tightness and shortness of breath.   Cardiovascular: Negative for chest pain and palpitations.  Gastrointestinal: Negative for abdominal pain, constipation, diarrhea, nausea and vomiting.  Endocrine: Negative.   Genitourinary: Negative for dysuria and frequency.  Musculoskeletal: Negative for arthralgias, back pain, joint swelling and neck pain.  Skin: Negative for rash.  Allergic/Immunologic: Negative.   Neurological: Negative for tremors and numbness.  Hematological: Negative for adenopathy. Does not bruise/bleed easily.  Psychiatric/Behavioral: Negative for behavioral problems and sleep disturbance. The patient is not nervous/anxious.      Vital Signs: BP 108/74   Pulse 71   Temp 97.6 F (36.4 C)   Resp 16   Ht 5\' 6"  (1.676 m)   Wt 160 lb (72.6 kg)   SpO2 100%   BMI 25.82 kg/m    Physical Exam Vitals and nursing note  reviewed. Exam conducted with a chaperone present.  Constitutional:      General: She is not in acute distress.    Appearance: She is well-developed. She is not diaphoretic.  HENT:     Head: Normocephalic and atraumatic.     Mouth/Throat:     Pharynx: No oropharyngeal exudate.  Eyes:     Pupils: Pupils are equal, round, and reactive to light.  Neck:     Thyroid: No  thyromegaly.     Vascular: No JVD.     Trachea: No tracheal deviation.  Cardiovascular:     Rate and Rhythm: Normal rate and regular rhythm.     Heart sounds: Normal heart sounds. No murmur. No friction rub. No gallop.   Pulmonary:     Effort: Pulmonary effort is normal. No respiratory distress.     Breath sounds: Normal breath sounds. No wheezing or rales.  Chest:     Chest wall: No tenderness.     Comments: Exam Chaperoned by Corlis Hove CMA Abdominal:     Palpations: Abdomen is soft.     Tenderness: There is no abdominal tenderness. There is no guarding.  Musculoskeletal:        General: Normal range of motion.     Cervical back: Normal range of motion and neck supple.  Lymphadenopathy:     Cervical: No cervical adenopathy.  Skin:    General: Skin is warm and dry.  Neurological:     Mental Status: She is alert and oriented to person, place, and time.     Cranial Nerves: No cranial nerve deficit.  Psychiatric:        Behavior: Behavior normal.        Thought Content: Thought content normal.        Judgment: Judgment normal.     Comments: Tearful when discussing her family and issues with anxiety      LABS: Recent Results (from the past 2160 hour(s))  NuSwab VG+, HSV     Status: None   Collection Time: 03/25/19 10:34 AM  Result Value Ref Range   Atopobium vaginae Low - 0 Score   BVAB 2 Low - 0 Score   Megasphaera 1 Low - 0 Score    Comment: Calculate total score by adding the 3 individual bacterial vaginosis (BV) marker scores together.  Total score is interpreted as follows: Total score 0-1: Indicates the absence of BV. Total score   2: Indeterminate for BV. Additional clinical                  data should be evaluated to establish a                  diagnosis. Total score 3-6: Indicates the presence of BV. This test was developed and its performance characteristics determined by [facility].  It has not been cleared or approved by the Food and Drug Administration.   The FDA has determined that such clearance or approval is not necessary.    Candida albicans, NAA Negative Negative   Candida glabrata, NAA Negative Negative   Trich vag by NAA Negative Negative   Chlamydia trachomatis, NAA Negative Negative   Neisseria gonorrhoeae, NAA Negative Negative   HSV 1 NAA Negative Negative   HSV 2 NAA Negative Negative  Chlamydia/Gonococcus/Trichomonas, NAA     Status: None   Collection Time: 03/25/19 11:03 AM   Specimen: Urine   URINE  Result Value Ref Range   Chlamydia by NAA Negative Negative  Gonococcus by NAA Negative Negative   Trich vag by NAA Negative Negative  POCT Urinalysis Dipstick     Status: Normal   Collection Time: 03/25/19 12:18 PM  Result Value Ref Range   Color, UA     Clarity, UA     Glucose, UA Negative Negative   Bilirubin, UA Negative    Ketones, UA Negative    Spec Grav, UA 1.010 1.010 - 1.025   Blood, UA Negative    pH, UA 7.5 5.0 - 8.0   Protein, UA Negative Negative   Urobilinogen, UA 0.2 0.2 or 1.0 E.U./dL   Nitrite, UA Negative    Leukocytes, UA Negative Negative   Appearance     Odor    POCT HgB A1C     Status: None   Collection Time: 03/25/19 12:22 PM  Result Value Ref Range   Hemoglobin A1C 5.2 4.0 - 5.6 %   HbA1c POC (<> result, manual entry)     HbA1c, POC (prediabetic range)     HbA1c, POC (controlled diabetic range)       Assessment/Plan: 1. Encounter for general adult medical examination with abnormal findings Well appearing 24 year old [place] American female, with issues of anxiety to discuss today. Other than this reports being in good health. PAP smear performed today, otherwise up to date on PHM. - TSH - T4, free - Comprehensive metabolic panel  2. Anxiety, generalized Start Lexapro 10 mg daily. Discussed with her that this medication can take 6-8 weeks to start taking effect and she may not notice any changes in her anxiety until that time. Also discussed with her that if she ever feels that  she would like to stop medication that she cannot abruptly stop that she will need to discuss this with me so we can establish a tapering plan. Also discussed with her the importance of avoiding alcohol while taking this medication as this mixture can cause harmful side effects. - escitalopram (LEXAPRO) 10 MG tablet; Take 1 tablet (10 mg total) by mouth daily.  Dispense: 30 tablet; Refill: 0  3. Other fatigue - CBC with Differential/Platelet - Lipid Panel With LDL/HDL Ratio - B12 and Folate Panel - Vitamin D 1,25 dihydroxy - Fe+TIBC+Fer  4. Dysuria                           - UA/M w/rflx Culture, Routine  General Counseling: Glory verbalizes understanding of the findings of todays visit and agrees with plan of treatment. I have discussed any further diagnostic evaluation that may be needed or ordered today. We also reviewed her medications today. she has been encouraged to call the office with any questions or concerns that should arise related to todays visit.   Orders Placed This Encounter  Procedures  . UA/M w/rflx Culture, Routine    No orders of the defined types were placed in this encounter.   Time spent:30 Minutes   This patient was seen by Blima Ledger AGNP-C in Collaboration with Dr Lyndon Code as a part of collaborative care agreement    Johnna Acosta AGNP-C Internal Medicine

## 2019-05-29 LAB — UA/M W/RFLX CULTURE, ROUTINE
Bilirubin, UA: NEGATIVE
Glucose, UA: NEGATIVE
Ketones, UA: NEGATIVE
Leukocytes,UA: NEGATIVE
Nitrite, UA: NEGATIVE
Protein,UA: NEGATIVE
RBC, UA: NEGATIVE
Specific Gravity, UA: 1.021 (ref 1.005–1.030)
Urobilinogen, Ur: 0.2 mg/dL (ref 0.2–1.0)
pH, UA: 6.5 (ref 5.0–7.5)

## 2019-05-29 LAB — MICROSCOPIC EXAMINATION
Casts: NONE SEEN /lpf
RBC, Urine: NONE SEEN /hpf (ref 0–2)

## 2019-06-02 LAB — IGP, APTIMA HPV: HPV Aptima: NEGATIVE

## 2019-07-09 ENCOUNTER — Telehealth: Payer: Self-pay

## 2019-07-09 NOTE — Telephone Encounter (Signed)
Called lmom informing patient of appointment on 07/11/2019. klh 

## 2019-07-11 ENCOUNTER — Ambulatory Visit (INDEPENDENT_AMBULATORY_CARE_PROVIDER_SITE_OTHER): Admitting: Adult Health

## 2019-07-11 ENCOUNTER — Encounter: Payer: Self-pay | Admitting: Adult Health

## 2019-07-11 VITALS — Resp 16 | Ht 66.0 in | Wt 156.0 lb

## 2019-07-11 DIAGNOSIS — F411 Generalized anxiety disorder: Secondary | ICD-10-CM

## 2019-07-11 DIAGNOSIS — R5383 Other fatigue: Secondary | ICD-10-CM

## 2019-07-11 MED ORDER — ESCITALOPRAM OXALATE 10 MG PO TABS: 10.0000 mg | ORAL_TABLET | Freq: Every day | ORAL | 1 refills | Status: DC

## 2019-07-11 NOTE — Progress Notes (Signed)
[facility] [address] [place], [place] [address]  Internal MEDICINE  Telephone Visit  Patient Name: Yvonne Christensen  [id number]  [id number]  Date of Service: 07/11/2019  I connected with the patient at 1054 by telephone and verified the patients identity using two identifiers.   I discussed the limitations, risks, security and privacy concerns of performing an evaluation and management service by telephone and the availability of in person appointments. I also discussed with the patient that there may be a patient responsible charge related to the service.  The patient expressed understanding and agrees to proceed.    Chief Complaint  Patient presents with  . Telephone Assessment  . Telephone Screen  . Follow-up    HPI  PT is seen via video.  She forgot to have her labs drawn, and she will do that tomorrow.  She was started on lexapro for anxiety at last visit, and is doing well with this.  She does have some minor anxiety mostly at night.  She has started taking the lexapro at night, and has excellent results with that.  Continues to deny SI or HI.     Current Medication: Outpatient Encounter Medications as of 07/11/2019  Medication Sig  . escitalopram (LEXAPRO) 10 MG tablet Take 1 tablet (10 mg total) by mouth daily.  . fluconazole (DIFLUCAN) 150 MG tablet Take 1 tablet (150 mg total) by mouth every 3 (three) days.  . Multiple Vitamin (MULTIVITAMIN) tablet Take 1 tablet by mouth daily. Prenatal vitamin  . triamcinolone cream (KENALOG) 0.1 % Apply 1 application topically 2 (two) times daily. Apply to affected area twice daily for up to 2 weeks for eczema  . [DISCONTINUED] escitalopram (LEXAPRO) 10 MG tablet Take 1 tablet (10 mg total) by mouth daily.   No facility-administered encounter medications on file as of 07/11/2019.    Surgical History: Past Surgical History:  Procedure Laterality Date  . CESAREAN SECTION  05/12/2016  . WISDOM TOOTH EXTRACTION  2013    g/a    Medical History: Past Medical History:  Diagnosis Date  . Bacterial vaginosis 03/2016   being treated, dosnot remember med name    Family History: Family History  Problem Relation Age of Onset  . Hypertension Maternal Grandmother   . Cancer Maternal Grandfather   . Healthy Mother   . Cirrhosis Father     Social History   Socioeconomic History  . Marital status: Single    Spouse name: Not on file  . Number of children: Not on file  . Years of education: Not on file  . Highest education level: Not on file  Occupational History  . Not on file  Tobacco Use  . Smoking status: Never Smoker  . Smokeless tobacco: Never Used  Substance and Sexual Activity  . Alcohol use: Not Currently  . Drug use: No  . Sexual activity: Yes  Other Topics Concern  . Not on file  Social History Narrative  . Not on file   Social Determinants of Health   Financial Resource Strain:   . Difficulty of Paying Living Expenses: Not on file  Food Insecurity:   . Worried About Programme researcher, broadcasting/film/video in the Last Year: Not on file  . Ran Out of Food in the Last Year: Not on file  Transportation Needs:   . Lack of Transportation (Medical): Not on file  . Lack of Transportation (Non-Medical): Not on file  Physical Activity:   . Days of Exercise per Week: Not  on file  . Minutes of Exercise per Session: Not on file  Stress:   . Feeling of Stress : Not on file  Social Connections:   . Frequency of Communication with Friends and Family: Not on file  . Frequency of Social Gatherings with Friends and Family: Not on file  . Attends Religious Services: Not on file  . Active Member of Clubs or Organizations: Not on file  . Attends Archivist Meetings: Not on file  . Marital Status: Not on file  Intimate Partner Violence:   . Fear of Current or Ex-Partner: Not on file  . Emotionally Abused: Not on file  . Physically Abused: Not on file  . Sexually Abused: Not on file      Review of  Systems  Constitutional: Positive for fatigue. Negative for chills and unexpected weight change.  HENT: Negative for congestion, rhinorrhea, sneezing and sore throat.   Eyes: Negative for photophobia, pain and redness.  Respiratory: Negative for cough, chest tightness and shortness of breath.   Cardiovascular: Negative for chest pain and palpitations.  Gastrointestinal: Negative for abdominal pain, constipation, diarrhea, nausea and vomiting.  Endocrine: Negative.   Genitourinary: Negative for dysuria and frequency.  Musculoskeletal: Negative for arthralgias, back pain, joint swelling and neck pain.  Skin: Negative for rash.  Allergic/Immunologic: Negative.   Neurological: Negative for tremors and numbness.  Hematological: Negative for adenopathy. Does not bruise/bleed easily.  Psychiatric/Behavioral: Negative for behavioral problems and sleep disturbance. The patient is not nervous/anxious.     Vital Signs: Resp 16   Ht 5\' 6"  (1.676 m)   Wt 156 lb (70.8 kg)   BMI 25.18 kg/m    Observation/Objective: Well appearing, NAD noted.    Assessment/Plan: 1. GAD (generalized anxiety disorder) Good results with lexapro.  Will follow up with patient in 3 months, sooner if she needs to increase dose, or has any issues.  - escitalopram (LEXAPRO) 10 MG tablet; Take 1 tablet (10 mg total) by mouth daily.  Dispense: 90 tablet; Refill: 1  2. Other fatigue Still has some lingering symptoms, fatigue labs were ordered, patient will have them drawn tomorrow and follow up accordingly   General Counseling: Yvonne Christensen verbalizes understanding of the findings of today's phone visit and agrees with plan of treatment. I have discussed any further diagnostic evaluation that may be needed or ordered today. We also reviewed her medications today. she has been encouraged to call the office with any questions or concerns that should arise related to todays visit.    No orders of the defined types were placed  in this encounter.   Meds ordered this encounter  Medications  . escitalopram (LEXAPRO) 10 MG tablet    Sig: Take 1 tablet (10 mg total) by mouth daily.    Dispense:  90 tablet    Refill:  1    Time spent: Steele AGNP-C Internal medicine

## 2019-07-12 ENCOUNTER — Ambulatory Visit: Admitting: Adult Health

## 2019-07-16 ENCOUNTER — Emergency Department
Admission: EM | Admit: 2019-07-16 | Discharge: 2019-07-16 | Disposition: A | Discharge status: Home / Self Care | Attending: Emergency Medicine | Admitting: Emergency Medicine

## 2019-07-16 ENCOUNTER — Other Ambulatory Visit: Payer: Self-pay

## 2019-07-16 ENCOUNTER — Encounter: Payer: Self-pay | Admitting: Emergency Medicine

## 2019-07-16 DIAGNOSIS — U071 COVID-19: Secondary | ICD-10-CM | POA: Insufficient documentation

## 2019-07-16 DIAGNOSIS — Z79899 Other long term (current) drug therapy: Secondary | ICD-10-CM | POA: Insufficient documentation

## 2019-07-16 DIAGNOSIS — B349 Viral infection, unspecified: Secondary | ICD-10-CM

## 2019-07-16 DIAGNOSIS — R519 Headache, unspecified: Secondary | ICD-10-CM | POA: Diagnosis present

## 2019-07-16 LAB — SARS CORONAVIRUS 2 (TAT 6-24 HRS): SARS Coronavirus 2: POSITIVE — AB

## 2019-07-16 NOTE — Discharge Instructions (Signed)
Follow discharge care instructions and self quarantine pending results of COVID-19 test.  If test is positive must quarantine for additional 10 days.

## 2019-07-16 NOTE — ED Notes (Signed)
Pt contacted about positive Covid results, home care instructions reviewed. Encouraged to come back for urgent worsening symptoms.

## 2019-07-16 NOTE — ED Triage Notes (Signed)
Pt c/o HA with loss of smell since Friday.Yvonne Christensen

## 2019-07-16 NOTE — ED Notes (Signed)
See triage note  Presents with headache for several days.States pain is at right temporal area    States has tried OTC meds w/o relief  Denies nay fever,n/v or cough  But states she lost her smell

## 2019-07-16 NOTE — ED Provider Notes (Signed)
[facility] Emergency Department Provider Note   ____________________________________________   First MD Initiated Contact with Patient 07/16/19 [phone]     (approximate)  I have reviewed the triage vital signs and the nursing notes.   HISTORY  Chief Complaint URI    HPI Yvonne Christensen is a 24 y.o. female patient presents with headache and loss of smell 4 days.  Patient also complain of sinus congestion and fatigue.  Patient denies sore throat, cough, nausea, vomiting, diarrhea.  Patient denies recent travel or known contact with COVID-19.  Patient rates her pain/ discomfort as a 5/10.  Patient described her pain/discomfort as achy".  No palliative measure for complaint.         Past Medical History:  Diagnosis Date  . Bacterial vaginosis 03/2016   being treated, dosnot remember med name    Patient Active Problem List   Diagnosis Date Noted  . Acute non-recurrent pansinusitis 10/21/2017  . Chest pain, unspecified 06/23/2017  . Encounter for surveillance of injectable contraceptive 06/23/2017  . Encounter for pregnancy test, result negative 06/23/2017  . Contraception management 12/01/2016  . Encounter for management and injection of injectable progestin contraceptive 12/01/2016  . Vaginal yeast infection 12/01/2016  . Decreased fetal movement in pregnancy, antepartum 04/23/2016  . Labor and delivery, indication for care 04/21/2016  . Leakage of amniotic fluid 03/25/2016  . History of chlamydia 01/01/2016    Past Surgical History:  Procedure Laterality Date  . CESAREAN SECTION  05/12/2016  . WISDOM TOOTH EXTRACTION  2013   g/a    Prior to Admission medications   Medication Sig Start Date End Date Taking? Authorizing Provider  escitalopram (LEXAPRO) 10 MG tablet Take 1 tablet (10 mg total) by mouth daily. 07/11/19   Kendell Bane, NP  fluconazole (DIFLUCAN) 150 MG tablet Take 1 tablet (150 mg total) by mouth every 3 (three) days. 03/25/19    Kendell Bane, NP  Multiple Vitamin (MULTIVITAMIN) tablet Take 1 tablet by mouth daily. Prenatal vitamin    [provider]    Allergies Latex and Penicillins  Family History  Problem Relation Age of Onset  . Hypertension Maternal Grandmother   . Cancer Maternal Grandfather   . Healthy Mother   . Cirrhosis Father     Social History Social History   Tobacco Use  . Smoking status: Never Smoker  . Smokeless tobacco: Never Used  Substance Use Topics  . Alcohol use: Not Currently  . Drug use: No    Review of Systems Constitutional: No fever/chills.  Body aches and fatigue. Eyes: No visual changes. ENT: No sore throat.  Loss of smell. Cardiovascular: Denies chest pain. Respiratory: Denies shortness of breath. Gastrointestinal: No abdominal pain.  No nausea, no vomiting.  No diarrhea.  No constipation. Genitourinary: Negative for dysuria. Musculoskeletal: Negative for back pain. Skin: Negative for rash. Neurological: Positive for headaches, but denies focal weakness or numbness. Allergic/Immunilogical: Latex and penicillin. ____________________________________________   PHYSICAL EXAM:  VITAL SIGNS: ED Triage Vitals  Enc Vitals Group     BP 07/16/19 0948 109/74     Pulse Rate 07/16/19 0948 85     Resp 07/16/19 0948 16     Temp 07/16/19 0948 98.3 F (36.8 C)     Temp Source 07/16/19 0948 Oral     SpO2 07/16/19 0948 100 %     Weight 07/16/19 0949 156 lb 1.4 oz (70.8 kg)     Height 07/16/19 0948 5\' 6"  (1.676 m)  Head Circumference --      Peak Flow --      Pain Score 07/16/19 0947 5     Pain Loc --      Pain Edu? --      Excl. in GC? --    Constitutional: Alert and oriented. Well appearing and in no acute distress. Nose: Edematous nasal turbinates clear rhinorrhea.  Mouth/Throat: Mucous membranes are moist.  Oropharynx non-erythematous. Neck: No stridor.  Hematological/Lymphatic/Immunilogical: No cervical lymphadenopathy. Cardiovascular: Normal  rate, regular rhythm. Grossly normal heart sounds.  Good peripheral circulation. Respiratory: Normal respiratory effort.  No retractions. Lungs CTAB. Gastrointestinal: Soft and nontender. No distention. No abdominal bruits. No CVA tenderness. Skin:  Skin is warm, dry and intact. No rash noted. Psychiatric: Mood and affect are normal. Speech and behavior are normal.  ____________________________________________   LABS (all labs ordered are listed, but only abnormal results are displayed)  Labs Reviewed - No data to display ____________________________________________  EKG   ____________________________________________  RADIOLOGY  ED MD interpretation:    Official radiology report(s): No results found.  ____________________________________________   PROCEDURES  Procedure(s) performed (including Critical Care):  Procedures   ____________________________________________   INITIAL IMPRESSION / ASSESSMENT AND PLAN / ED COURSE  As part of my medical decision making, I reviewed the following data within the electronic MEDICAL RECORD NUMBER     Patient presents with loss of smell, fatigue, and nasal congestion.  Patient physical exam is consistent with viral illness.  Patient given discharge care instruction advised self quarantine pending results of COVID-19 test.    Yvonne Christensen was evaluated in Emergency Department on 07/16/2019 for the symptoms described in the history of present illness. She was evaluated in the context of the global COVID-19 pandemic, which necessitated consideration that the patient might be at risk for infection with the SARS-CoV-2 virus that causes COVID-19. Institutional protocols and algorithms that pertain to the evaluation of patients at risk for COVID-19 are in a state of rapid change based on information released by regulatory bodies including the CDC and federal and state organizations. These policies and algorithms were followed during the patient's  care in the ED.       ____________________________________________   FINAL CLINICAL IMPRESSION(S) / ED DIAGNOSES  Final diagnoses:  Viral illness     ED Discharge Orders    None       Note:  This document was prepared using Dragon voice recognition software and may include unintentional dictation errors.    Joni Reining, PA-C 07/16/19 1010    Sharman Cheek, MD 07/16/19 [phone]

## 2019-10-04 ENCOUNTER — Telehealth: Payer: Self-pay

## 2019-10-04 NOTE — Telephone Encounter (Signed)
Called lmom informing patient of appointment on 10/08/2019. klh 

## 2019-10-08 ENCOUNTER — Other Ambulatory Visit: Payer: Self-pay

## 2019-10-08 ENCOUNTER — Ambulatory Visit (INDEPENDENT_AMBULATORY_CARE_PROVIDER_SITE_OTHER): Admitting: Adult Health

## 2019-10-08 ENCOUNTER — Encounter: Payer: Self-pay | Admitting: Adult Health

## 2019-10-08 VITALS — BP 103/73 | HR 75 | Temp 97.5°F | Resp 16 | Ht 66.0 in | Wt 154.8 lb

## 2019-10-08 DIAGNOSIS — M24851 Other specific joint derangements of right hip, not elsewhere classified: Secondary | ICD-10-CM

## 2019-10-08 DIAGNOSIS — B379 Candidiasis, unspecified: Secondary | ICD-10-CM

## 2019-10-08 DIAGNOSIS — Z114 Encounter for screening for human immunodeficiency virus [HIV]: Secondary | ICD-10-CM | POA: Diagnosis not present

## 2019-10-08 DIAGNOSIS — F411 Generalized anxiety disorder: Secondary | ICD-10-CM

## 2019-10-08 MED ORDER — FLUCONAZOLE 150 MG PO TABS: 150.0000 mg | ORAL_TABLET | ORAL | 0 refills | Status: DC

## 2019-10-08 NOTE — Progress Notes (Signed)
[facility] [place], [place] [address]  Internal MEDICINE  Office Visit Note  Patient Name: Yvonne Christensen  [id number]  [id number]  Date of Service: 10/08/2019  Chief Complaint  Patient presents with  . Follow-up    anxiety medication survelilance    HPI  Pt is here for follow up on anxiety.  She reports since last visit she contracted covid. She did well, and has since recovered.  She continues to take Lexapro 10mg  daily with excellent results.  She denies any new or worsening symptoms at this time.   Patient reports multiple years of right hip popping while running.  It give her discomfort at times.  She runs often due to being in the [employer], and would like it evaluated by ortho.   Current Medication: Outpatient Encounter Medications as of 10/08/2019  Medication Sig  . escitalopram (LEXAPRO) 10 MG tablet Take 1 tablet (10 mg total) by mouth daily.  . fluconazole (DIFLUCAN) 150 MG tablet Take 1 tablet (150 mg total) by mouth every 3 (three) days.  . Multiple Vitamin (MULTIVITAMIN) tablet Take 1 tablet by mouth daily. Prenatal vitamin   No facility-administered encounter medications on file as of 10/08/2019.    Surgical History: Past Surgical History:  Procedure Laterality Date  . CESAREAN SECTION  05/12/2016  . WISDOM TOOTH EXTRACTION  2013   g/a    Medical History: Past Medical History:  Diagnosis Date  . Bacterial vaginosis 03/2016   being treated, dosnot remember med name    Family History: Family History  Problem Relation Age of Onset  . Hypertension Maternal Grandmother   . Cancer Maternal Grandfather   . Healthy Mother   . Cirrhosis Father     Social History   Socioeconomic History  . Marital status: Single    Spouse name: Not on file  . Number of children: Not on file  . Years of education: Not on file  . Highest education level: Not on file  Occupational History  . Not on file  Tobacco Use  . Smoking status: Never  Smoker  . Smokeless tobacco: Never Used  Substance and Sexual Activity  . Alcohol use: Not Currently  . Drug use: No  . Sexual activity: Yes  Other Topics Concern  . Not on file  Social History Narrative  . Not on file   Social Determinants of Health   Financial Resource Strain:   . Difficulty of Paying Living Expenses:   Food Insecurity:   . Worried About Charity fundraiser in the Last Year:   . Arboriculturist in the Last Year:   Transportation Needs:   . Film/video editor (Medical):   Marland Kitchen Lack of Transportation (Non-Medical):   Physical Activity:   . Days of Exercise per Week:   . Minutes of Exercise per Session:   Stress:   . Feeling of Stress :   Social Connections:   . Frequency of Communication with Friends and Family:   . Frequency of Social Gatherings with Friends and Family:   . Attends Religious Services:   . Active Member of Clubs or Organizations:   . Attends Archivist Meetings:   Marland Kitchen Marital Status:   Intimate Partner Violence:   . Fear of Current or Ex-Partner:   . Emotionally Abused:   Marland Kitchen Physically Abused:   . Sexually Abused:       Review of Systems  Constitutional: Negative for chills, fatigue and unexpected weight change.  HENT: Negative for congestion, rhinorrhea, sneezing and sore throat.   Eyes: Negative for photophobia, pain and redness.  Respiratory: Negative for cough, chest tightness and shortness of breath.   Cardiovascular: Negative for chest pain and palpitations.  Gastrointestinal: Negative for abdominal pain, constipation, diarrhea, nausea and vomiting.  Endocrine: Negative.   Genitourinary: Negative for dysuria and frequency.  Musculoskeletal: Negative for arthralgias, back pain, joint swelling and neck pain.  Skin: Negative for rash.  Allergic/Immunologic: Negative.   Neurological: Negative for tremors and numbness.  Hematological: Negative for adenopathy. Does not bruise/bleed easily.  Psychiatric/Behavioral:  Negative for behavioral problems and sleep disturbance. The patient is not nervous/anxious.     Vital Signs: BP 103/73   Pulse 75   Temp (!) 97.5 F (36.4 C)   Resp 16   Ht 5\' 6"  (1.676 m)   Wt 154 lb 12.8 oz (70.2 kg)   SpO2 100%   BMI 24.99 kg/m    Physical Exam Vitals and nursing note reviewed.  Constitutional:      General: She is not in acute distress.    Appearance: She is well-developed. She is not diaphoretic.  HENT:     Head: Normocephalic and atraumatic.     Mouth/Throat:     Pharynx: No oropharyngeal exudate.  Eyes:     Pupils: Pupils are equal, round, and reactive to light.  Neck:     Thyroid: No thyromegaly.     Vascular: No JVD.     Trachea: No tracheal deviation.  Cardiovascular:     Rate and Rhythm: Normal rate and regular rhythm.     Heart sounds: Normal heart sounds. No murmur. No friction rub. No gallop.   Pulmonary:     Effort: Pulmonary effort is normal. No respiratory distress.     Breath sounds: Normal breath sounds. No wheezing or rales.  Chest:     Chest wall: No tenderness.  Abdominal:     Palpations: Abdomen is soft.     Tenderness: There is no abdominal tenderness. There is no guarding.  Musculoskeletal:        General: Normal range of motion.     Cervical back: Normal range of motion and neck supple.  Lymphadenopathy:     Cervical: No cervical adenopathy.  Skin:    General: Skin is warm and dry.  Neurological:     Mental Status: She is alert and oriented to person, place, and time.     Cranial Nerves: No cranial nerve deficit.  Psychiatric:        Behavior: Behavior normal.        Thought Content: Thought content normal.        Judgment: Judgment normal.    Assessment/Plan: 1. GAD (generalized anxiety disorder) Continue lexapro as prescribed.    2. Yeast infection Use Diflucan as prescribed.   3. Screening for HIV (human immunodeficiency virus) - HIV antibody (with reflex)  4. Right hip crepitus Referral to ortho per  patient request.  - Ambulatory referral to Orthopedic Surgery  General Counseling: Nakari verbalizes understanding of the findings of todays visit and agrees with plan of treatment. I have discussed any further diagnostic evaluation that may be needed or ordered today. We also reviewed her medications today. she has been encouraged to call the office with any questions or concerns that should arise related to todays visit.    No orders of the defined types were placed in this encounter.   No orders of the defined types were placed in this  encounter.   Time spent: 30 Minutes   This patient was seen by Orson Gear AGNP-C in Collaboration with Dr Lavera Guise as a part of collaborative care agreement     Kendell Bane AGNP-C Internal medicine

## 2019-10-14 LAB — IRON,TIBC AND FERRITIN PANEL
Ferritin: 84 ng/mL (ref 15–150)
Iron Saturation: 21 % (ref 15–55)
Iron: 64 ug/dL (ref 27–159)
Total Iron Binding Capacity: 310 ug/dL (ref 250–450)
UIBC: 246 ug/dL (ref 131–425)

## 2019-10-14 LAB — COMPREHENSIVE METABOLIC PANEL
ALT: 15 IU/L (ref 0–32)
AST: 18 IU/L (ref 0–40)
Albumin/Globulin Ratio: 1.5 (ref 1.2–2.2)
Albumin: 4.3 g/dL (ref 3.9–5.0)
Alkaline Phosphatase: 65 IU/L (ref 48–121)
BUN/Creatinine Ratio: 15 (ref 9–23)
BUN: 13 mg/dL (ref 6–20)
Bilirubin Total: 0.5 mg/dL (ref 0.0–1.2)
CO2: 21 mmol/L (ref 20–29)
Calcium: 8.9 mg/dL (ref 8.7–10.2)
Chloride: 106 mmol/L (ref 96–106)
Creatinine, Ser: 0.85 mg/dL (ref 0.57–1.00)
GFR calc Af Amer: 109 mL/min/{1.73_m2} (ref >59)
GFR calc non Af Amer: 95 mL/min/{1.73_m2} (ref >59)
Globulin, Total: 2.9 g/dL (ref 1.5–4.5)
Glucose: 81 mg/dL (ref 65–99)
Potassium: 3.9 mmol/L (ref 3.5–5.2)
Sodium: 139 mmol/L (ref 134–144)
Total Protein: 7.2 g/dL (ref 6.0–8.5)

## 2019-10-14 LAB — VITAMIN D 1,25 DIHYDROXY
Vitamin D 1, 25 (OH)2 Total: 66 pg/mL — ABNORMAL HIGH
Vitamin D2 1, 25 (OH)2: <10 pg/mL
Vitamin D3 1, 25 (OH)2: 66 pg/mL

## 2019-10-14 LAB — CBC WITH DIFFERENTIAL/PLATELET
Basophils Absolute: 0 10*3/uL (ref 0.0–0.2)
Basos: 1 %
EOS (ABSOLUTE): 0 10*3/uL (ref 0.0–0.4)
Eos: 1 %
Hematocrit: 43.8 % (ref 34.0–46.6)
Hemoglobin: 14.5 g/dL (ref 11.1–15.9)
Immature Grans (Abs): 0 10*3/uL (ref 0.0–0.1)
Immature Granulocytes: 0 %
Lymphocytes Absolute: 1 10*3/uL (ref 0.7–3.1)
Lymphs: 19 %
MCH: 28 pg (ref 26.6–33.0)
MCHC: 33.1 g/dL (ref 31.5–35.7)
MCV: 85 fL (ref 79–97)
Monocytes Absolute: 0.3 10*3/uL (ref 0.1–0.9)
Monocytes: 6 %
Neutrophils Absolute: 3.9 10*3/uL (ref 1.4–7.0)
Neutrophils: 73 %
Platelets: 319 10*3/uL (ref 150–450)
RBC: 5.17 x10E6/uL (ref 3.77–5.28)
RDW: 12.7 % (ref 11.7–15.4)
WBC: 5.3 10*3/uL (ref 3.4–10.8)

## 2019-10-14 LAB — B12 AND FOLATE PANEL
Folate: 8.1 ng/mL (ref >3.0)
Vitamin B-12: 260 pg/mL (ref 232–1245)

## 2019-10-14 LAB — LIPID PANEL WITH LDL/HDL RATIO
Cholesterol, Total: 137 mg/dL (ref 100–199)
HDL: 65 mg/dL (ref >39)
LDL Chol Calc (NIH): 62 mg/dL (ref 0–99)
LDL/HDL Ratio: 1 ratio (ref 0.0–3.2)
Triglycerides: 44 mg/dL (ref 0–149)
VLDL Cholesterol Cal: 10 mg/dL (ref 5–40)

## 2019-10-14 LAB — TSH: TSH: 0.715 u[IU]/mL (ref 0.450–4.500)

## 2019-10-14 LAB — T4, FREE: Free T4: 0.97 ng/dL (ref 0.82–1.77)

## 2019-10-14 LAB — HIV ANTIBODY (ROUTINE TESTING W REFLEX): HIV Screen 4th Generation wRfx: NONREACTIVE

## 2019-10-18 ENCOUNTER — Telehealth: Payer: Self-pay

## 2019-10-18 NOTE — Telephone Encounter (Signed)
Spoke with pt stop any OTC vitamin d supplement is taking due to vitamin D is high and also she is not taking multivitamin

## 2019-11-14 ENCOUNTER — Telehealth: Payer: Self-pay

## 2019-11-14 DIAGNOSIS — M25851 Other specified joint disorders, right hip: Secondary | ICD-10-CM | POA: Insufficient documentation

## 2019-11-14 DIAGNOSIS — M2241 Chondromalacia patellae, right knee: Secondary | ICD-10-CM | POA: Insufficient documentation

## 2019-11-14 NOTE — Telephone Encounter (Signed)
Tried contacting patient to confirm appointment on 11/19/2019. klh

## 2019-11-19 ENCOUNTER — Ambulatory Visit: Admitting: Adult Health

## 2019-11-21 ENCOUNTER — Other Ambulatory Visit: Payer: Self-pay

## 2019-11-21 ENCOUNTER — Encounter: Payer: Self-pay | Admitting: Adult Health

## 2019-11-21 ENCOUNTER — Ambulatory Visit (INDEPENDENT_AMBULATORY_CARE_PROVIDER_SITE_OTHER): Admitting: Adult Health

## 2019-11-21 VITALS — BP 95/58 | HR 70 | Temp 97.3°F | Resp 16 | Ht 66.0 in | Wt 160.4 lb

## 2019-11-21 DIAGNOSIS — M25551 Pain in right hip: Secondary | ICD-10-CM | POA: Diagnosis not present

## 2019-11-21 DIAGNOSIS — F411 Generalized anxiety disorder: Secondary | ICD-10-CM

## 2019-11-21 MED ORDER — ESCITALOPRAM OXALATE 20 MG PO TABS: 10.0000 mg | ORAL_TABLET | Freq: Every day | ORAL | 1 refills | Status: DC

## 2019-11-21 NOTE — Progress Notes (Signed)
[facility] [address] [place], [place] [address]  Internal MEDICINE  Office Visit Note  Patient Name: Yvonne Christensen  [id number]  [id number]  Date of Service: 11/26/2019  Chief Complaint  Patient presents with  . Follow-up    review labs     HPI  Pt is here to review labs. Her labs are normal.  She saw ortho for her hip pain.  She is on meloxicam with great results.  She follows up with him for physical therapy later this month. She remains on lexapro 10mg  for anxiety.  She feels like it is working, but she does have some ongoing breakthrough times of anxiety.  She would like to try increasing the dose of her lexapro.      Current Medication: Outpatient Encounter Medications as of 11/21/2019  Medication Sig  . escitalopram (LEXAPRO) 20 MG tablet Take 0.5 tablets (10 mg total) by mouth daily.  . fluconazole (DIFLUCAN) 150 MG tablet Take 1 tablet (150 mg total) by mouth every 3 (three) days.  . [DISCONTINUED] escitalopram (LEXAPRO) 10 MG tablet Take 1 tablet (10 mg total) by mouth daily.  . [DISCONTINUED] Multiple Vitamin (MULTIVITAMIN) tablet Take 1 tablet by mouth daily. Prenatal vitamin  . [DISCONTINUED] fluconazole (DIFLUCAN) 150 MG tablet Take 1 tablet (150 mg total) by mouth every 3 (three) days.   No facility-administered encounter medications on file as of 11/21/2019.    Surgical History: Past Surgical History:  Procedure Laterality Date  . CESAREAN SECTION  05/12/2016  . WISDOM TOOTH EXTRACTION  2013   g/a    Medical History: Past Medical History:  Diagnosis Date  . Bacterial vaginosis 03/2016   being treated, dosnot remember med name    Family History: Family History  Problem Relation Age of Onset  . Hypertension Maternal Grandmother   . Cancer Maternal Grandfather   . Healthy Mother   . Cirrhosis Father     Social History   Socioeconomic History  . Marital status: Single    Spouse name: Not on file  . Number of children: Not on file   . Years of education: Not on file  . Highest education level: Not on file  Occupational History  . Not on file  Tobacco Use  . Smoking status: Never Smoker  . Smokeless tobacco: Never Used  Vaping Use  . Vaping Use: Never used  Substance and Sexual Activity  . Alcohol use: Not Currently  . Drug use: No  . Sexual activity: Yes  Other Topics Concern  . Not on file  Social History Narrative  . Not on file   Social Determinants of Health   Financial Resource Strain:   . Difficulty of Paying Living Expenses:   Food Insecurity:   . Worried About 04/2016 in the Last Year:   . Programme researcher, broadcasting/film/video in the Last Year:   Transportation Needs:   . Barista (Medical):   Freight forwarder Lack of Transportation (Non-Medical):   Physical Activity:   . Days of Exercise per Week:   . Minutes of Exercise per Session:   Stress:   . Feeling of Stress :   Social Connections:   . Frequency of Communication with Friends and Family:   . Frequency of Social Gatherings with Friends and Family:   . Attends Religious Services:   . Active Member of Clubs or Organizations:   . Attends Marland Kitchen Meetings:   Banker Marital Status:   Intimate Partner Violence:   .  Fear of Current or Ex-Partner:   . Emotionally Abused:   Marland Kitchen Physically Abused:   . Sexually Abused:       Review of Systems  Constitutional: Negative for chills, fatigue and unexpected weight change.  HENT: Negative for congestion, rhinorrhea, sneezing and sore throat.   Eyes: Negative for photophobia, pain and redness.  Respiratory: Negative for cough, chest tightness and shortness of breath.   Cardiovascular: Negative for chest pain and palpitations.  Gastrointestinal: Negative for abdominal pain, constipation, diarrhea, nausea and vomiting.  Endocrine: Negative.   Genitourinary: Negative for dysuria and frequency.  Musculoskeletal: Negative for arthralgias, back pain, joint swelling and neck pain.  Skin: Negative for  rash.  Allergic/Immunologic: Negative.   Neurological: Negative for tremors and numbness.  Hematological: Negative for adenopathy. Does not bruise/bleed easily.  Psychiatric/Behavioral: Negative for behavioral problems and sleep disturbance. The patient is not nervous/anxious.     Vital Signs: BP (!) 95/58   Pulse 70   Temp (!) 97.3 F (36.3 C)   Resp 16   Ht 5\' 6"  (1.676 m)   Wt 160 lb 6.4 oz (72.8 kg)   SpO2 99%   BMI 25.89 kg/m    Physical Exam Vitals and nursing note reviewed.  Constitutional:      General: She is not in acute distress.    Appearance: She is well-developed. She is not diaphoretic.  HENT:     Head: Normocephalic and atraumatic.     Mouth/Throat:     Pharynx: No oropharyngeal exudate.  Eyes:     Pupils: Pupils are equal, round, and reactive to light.  Neck:     Thyroid: No thyromegaly.     Vascular: No JVD.     Trachea: No tracheal deviation.  Cardiovascular:     Rate and Rhythm: Normal rate and regular rhythm.     Heart sounds: Normal heart sounds. No murmur heard.  No friction rub. No gallop.   Pulmonary:     Effort: Pulmonary effort is normal. No respiratory distress.     Breath sounds: Normal breath sounds. No wheezing or rales.  Chest:     Chest wall: No tenderness.  Abdominal:     Palpations: Abdomen is soft.     Tenderness: There is no abdominal tenderness. There is no guarding.  Musculoskeletal:        General: Normal range of motion.     Cervical back: Normal range of motion and neck supple.  Lymphadenopathy:     Cervical: No cervical adenopathy.  Skin:    General: Skin is warm and dry.  Neurological:     Mental Status: She is alert and oriented to person, place, and time.     Cranial Nerves: No cranial nerve deficit.  Psychiatric:        Behavior: Behavior normal.        Thought Content: Thought content normal.        Judgment: Judgment normal.    Assessment/Plan: 1. GAD (generalized anxiety disorder) Increase dose of  lexapro to 20mg  daily, and follow up as discussed.   - escitalopram (LEXAPRO) 20 MG tablet; Take 0.5 tablets (10 mg total) by mouth daily.  Dispense: 90 tablet; Refill: 1  2. Right hip pain Continue to see ortho and do PT as scheduled.    General Counseling: Jandy verbalizes understanding of the findings of todays visit and agrees with plan of treatment. I have discussed any further diagnostic evaluation that may be needed or ordered today. We also reviewed her  medications today. she has been encouraged to call the office with any questions or concerns that should arise related to todays visit.    No orders of the defined types were placed in this encounter.   Meds ordered this encounter  Medications  . escitalopram (LEXAPRO) 20 MG tablet    Sig: Take 0.5 tablets (10 mg total) by mouth daily.    Dispense:  90 tablet    Refill:  1    Time spent: 30 Minutes   This patient was seen by Blima Ledger AGNP-C in Collaboration with Dr Lyndon Code as a part of collaborative care agreement     Johnna Acosta AGNP-C Internal medicine

## 2020-01-21 ENCOUNTER — Telehealth: Payer: Self-pay

## 2020-01-21 NOTE — Telephone Encounter (Signed)
Confirmed and screened for 01-23-20 ov. 

## 2020-01-23 ENCOUNTER — Ambulatory Visit: Admitting: Hospice and Palliative Medicine

## 2020-02-03 ENCOUNTER — Telehealth: Payer: Self-pay

## 2020-02-03 NOTE — Telephone Encounter (Signed)
Confirmed and screened for 02-05-20 ov. 

## 2020-02-05 ENCOUNTER — Ambulatory Visit (INDEPENDENT_AMBULATORY_CARE_PROVIDER_SITE_OTHER): Admitting: Hospice and Palliative Medicine

## 2020-02-05 ENCOUNTER — Other Ambulatory Visit: Payer: Self-pay

## 2020-02-05 ENCOUNTER — Encounter: Payer: Self-pay | Admitting: Hospice and Palliative Medicine

## 2020-02-05 DIAGNOSIS — L409 Psoriasis, unspecified: Secondary | ICD-10-CM

## 2020-02-05 DIAGNOSIS — R079 Chest pain, unspecified: Secondary | ICD-10-CM

## 2020-02-05 DIAGNOSIS — F411 Generalized anxiety disorder: Secondary | ICD-10-CM | POA: Diagnosis not present

## 2020-02-05 DIAGNOSIS — F5101 Primary insomnia: Secondary | ICD-10-CM | POA: Diagnosis not present

## 2020-02-05 MED ORDER — TRAZODONE HCL 50 MG PO TABS: 25.0000 mg | ORAL_TABLET | Freq: Every evening | ORAL | 1 refills | Status: DC | PRN

## 2020-02-05 MED ORDER — TRIAMCINOLONE ACETONIDE 0.1 % EX CREA: 1.0000 "application " | TOPICAL_CREAM | Freq: Two times a day (BID) | CUTANEOUS | 3 refills | Status: DC

## 2020-02-05 NOTE — Progress Notes (Signed)
[facility] [address] [place], [place] [address]  Internal MEDICINE  Office Visit Note  Patient Name: Yvonne Christensen  [id number]  [id number]  Date of Service: 02/06/2020  Chief Complaint  Patient presents with  . Follow-up    refill request, sharp pain in chest off and on but getting more intense over the last month, pain in feet tingles, right knee will sometimes lock up  . Quality Metric Gaps    HepC    HPI Patient is here today for routine follow-up Her lexapro dose was recently increased to 20 mg, she has been on this dose not about 3 weeks She has noticed some improvement in her symptoms but still feels anxious at times Having chest pain a few times a month, mid to left side of chest, sharp chest pain, pain lasts 1-2 minutes then resolves spontaneously, can happen at anytime when she is walking or sitting down, she says this chest pain has been going on for years, for as long as she can remember, in the past has never thought to bring it up to her doctor We discussed her daily habits, she has a high stress job working for the [employer] in the [employer], she has a young daughter and is the primary caretaker for her, she averages about 2-3 hours of sleep per night and fuels herself with several caffeine beverages throughout the day   Current Medication: Outpatient Encounter Medications as of 02/05/2020  Medication Sig  . escitalopram (LEXAPRO) 20 MG tablet Take 0.5 tablets (10 mg total) by mouth daily.  . fluconazole (DIFLUCAN) 150 MG tablet Take 1 tablet (150 mg total) by mouth every 3 (three) days.  . traZODone (DESYREL) 50 MG tablet Take 0.5-1 tablets (25-50 mg total) by mouth at bedtime as needed for sleep.  Marland Kitchen triamcinolone cream (KENALOG) 0.1 % Apply 1 application topically 2 (two) times daily.   No facility-administered encounter medications on file as of 02/05/2020.    Surgical History: Past Surgical History:  Procedure Laterality Date  . CESAREAN  SECTION  05/12/2016  . WISDOM TOOTH EXTRACTION  2013   g/a    Medical History: Past Medical History:  Diagnosis Date  . Bacterial vaginosis 03/2016   being treated, dosnot remember med name    Family History: Family History  Problem Relation Age of Onset  . Hypertension Maternal Grandmother   . Cancer Maternal Grandfather   . Healthy Mother   . Cirrhosis Father     Social History   Socioeconomic History  . Marital status: Single    Spouse name: Not on file  . Number of children: Not on file  . Years of education: Not on file  . Highest education level: Not on file  Occupational History  . Not on file  Tobacco Use  . Smoking status: Never Smoker  . Smokeless tobacco: Never Used  Vaping Use  . Vaping Use: Never used  Substance and Sexual Activity  . Alcohol use: Not Currently  . Drug use: No  . Sexual activity: Yes  Other Topics Concern  . Not on file  Social History Narrative  . Not on file   Social Determinants of Health   Financial Resource Strain:   . Difficulty of Paying Living Expenses: Not on file  Food Insecurity:   . Worried About Programme researcher, broadcasting/film/video in the Last Year: Not on file  . Ran Out of Food in the Last Year: Not on file  Transportation Needs:   .  Lack of Transportation (Medical): Not on file  . Lack of Transportation (Non-Medical): Not on file  Physical Activity:   . Days of Exercise per Week: Not on file  . Minutes of Exercise per Session: Not on file  Stress:   . Feeling of Stress : Not on file  Social Connections:   . Frequency of Communication with Friends and Family: Not on file  . Frequency of Social Gatherings with Friends and Family: Not on file  . Attends Religious Services: Not on file  . Active Member of Clubs or Organizations: Not on file  . Attends Banker Meetings: Not on file  . Marital Status: Not on file  Intimate Partner Violence:   . Fear of Current or Ex-Partner: Not on file  . Emotionally Abused: Not  on file  . Physically Abused: Not on file  . Sexually Abused: Not on file   Review of Systems  Constitutional: Negative for chills, diaphoresis and fatigue.  HENT: Negative for ear pain, postnasal drip and sinus pressure.   Eyes: Negative for photophobia, discharge, redness, itching and visual disturbance.  Respiratory: Negative for cough, shortness of breath and wheezing.   Cardiovascular: Positive for chest pain. Negative for palpitations and leg swelling.  Gastrointestinal: Negative for abdominal pain, constipation, diarrhea, nausea and vomiting.  Genitourinary: Negative for dysuria and flank pain.  Musculoskeletal: Negative for arthralgias, back pain, gait problem and neck pain.  Skin: Negative for color change.  Allergic/Immunologic: Negative for environmental allergies and food allergies.  Neurological: Negative for dizziness and headaches.  Hematological: Does not bruise/bleed easily.  Psychiatric/Behavioral: Positive for sleep disturbance. Negative for agitation, behavioral problems (depression) and hallucinations. The patient is nervous/anxious.     Vital Signs: BP 97/69   Pulse 71   Temp 97.9 F (36.6 C)   Resp 16   Ht 5\' 6"  (1.676 m)   Wt 158 lb 12.8 oz (72 kg)   SpO2 100%   BMI 25.63 kg/m    Physical Exam Vitals reviewed.  Constitutional:      Appearance: Normal appearance. She is normal weight.  HENT:     Mouth/Throat:     Mouth: Mucous membranes are moist.     Pharynx: Oropharynx is clear.  Cardiovascular:     Rate and Rhythm: Normal rate and regular rhythm.     Pulses: Normal pulses.     Heart sounds: Normal heart sounds.  Pulmonary:     Effort: Pulmonary effort is normal.     Breath sounds: Normal breath sounds.  Abdominal:     General: Abdomen is flat.     Palpations: Abdomen is soft.  Musculoskeletal:        General: Normal range of motion.     Cervical back: Normal range of motion.  Skin:    General: Skin is warm.  Neurological:     General:  No focal deficit present.     Mental Status: She is alert and oriented to person, place, and time. Mental status is at baseline.  Psychiatric:        Mood and Affect: Mood normal.        Behavior: Behavior normal.        Thought Content: Thought content normal.    Assessment/Plan: 1. GAD (generalized anxiety disorder) Will continue with Lexapro increased dose for a few more weeks and assess response. May need to change therapy if anxiety persists.  2. Chest pain, unspecified type Normal EKG, recent labs from May normal, most consistent with  anxiety and likely secondary to lifestyle, lack of adequate sleep and excessive caffeine intake. - EKG 12-Lead  3. Primary insomnia Will try trazodone to help with her sleep. Encouraged to get in the bed earlier and to look into CBT to help manage her sleep. - traZODone (DESYREL) 50 MG tablet; Take 0.5-1 tablets (25-50 mg total) by mouth at bedtime as needed for sleep.  Dispense: 30 tablet; Refill: 1  4. Psoriasis Symptoms stable at this time, requesting refills. - triamcinolone cream (KENALOG) 0.1 %; Apply 1 application topically 2 (two) times daily.  Dispense: 45 g; Refill: 3  General Counseling: Yvonne Christensen verbalizes understanding of the findings of todays visit and agrees with plan of treatment. I have discussed any further diagnostic evaluation that may be needed or ordered today. We also reviewed her medications today. she has been encouraged to call the office with any questions or concerns that should arise related to todays visit.    Orders Placed This Encounter  Procedures  . EKG 12-Lead    Meds ordered this encounter  Medications  . traZODone (DESYREL) 50 MG tablet    Sig: Take 0.5-1 tablets (25-50 mg total) by mouth at bedtime as needed for sleep.    Dispense:  30 tablet    Refill:  1  . triamcinolone cream (KENALOG) 0.1 %    Sig: Apply 1 application topically 2 (two) times daily.    Dispense:  45 g    Refill:  3    Time  spent: 30 Minutes   This patient was seen by Leeanne Deed AGNP-C in Collaboration with Dr Lyndon Code as a part of collaborative care agreement     Lubertha Basque. Marguis Mathieson AGNP-C Internal medicine

## 2020-03-10 ENCOUNTER — Encounter: Payer: Self-pay | Admitting: Hospice and Palliative Medicine

## 2020-03-11 NOTE — Telephone Encounter (Signed)
Pt advised to schedule appt to have paperwork completed.

## 2020-03-17 ENCOUNTER — Encounter: Payer: Self-pay | Admitting: Internal Medicine

## 2020-03-17 ENCOUNTER — Ambulatory Visit (INDEPENDENT_AMBULATORY_CARE_PROVIDER_SITE_OTHER): Admitting: Internal Medicine

## 2020-03-17 ENCOUNTER — Other Ambulatory Visit: Payer: Self-pay

## 2020-03-17 VITALS — BP 100/70 | HR 75 | Temp 97.9°F | Resp 16 | Ht 66.0 in | Wt 163.2 lb

## 2020-03-17 DIAGNOSIS — Z79899 Other long term (current) drug therapy: Secondary | ICD-10-CM | POA: Diagnosis not present

## 2020-03-17 DIAGNOSIS — Z862 Personal history of diseases of the blood and blood-forming organs and certain disorders involving the immune mechanism: Secondary | ICD-10-CM | POA: Diagnosis not present

## 2020-03-17 DIAGNOSIS — M2550 Pain in unspecified joint: Secondary | ICD-10-CM | POA: Diagnosis not present

## 2020-03-17 DIAGNOSIS — Z872 Personal history of diseases of the skin and subcutaneous tissue: Secondary | ICD-10-CM

## 2020-03-17 NOTE — Progress Notes (Signed)
[facility] [address] [place], [place] [address]  Internal MEDICINE  Office Visit Note  Patient Name: Yvonne Christensen  [id number]  [id number]  Date of Service: 03/17/2020  Chief Complaint  Patient presents with  . Follow-up    [employer] paperwork  . policy update form    reviewed    HPI Pt is here for functionality paperwork for [employer]. Pt has a dx of SLE by a dermatologist. She has aches and pains and flare ups at times. However she does not a formal diagnosis of lupus. She has not been seen by rheumatology and I do not have her biopsy report. Pt informs me today that she had this dx after skin biopsy, had a facial butterfly rash in the past. she does not recall having any labs done, she is on plaquenil with no h/o regular ophthalmology follow ups. She is also using a cream  Denies any fever or chill, no sob or chest pain   Current Medication: Outpatient Encounter Medications as of 03/17/2020  Medication Sig  . escitalopram (LEXAPRO) 20 MG tablet Take 0.5 tablets (10 mg total) by mouth daily.  . fluconazole (DIFLUCAN) 150 MG tablet Take 1 tablet (150 mg total) by mouth every 3 (three) days.  . traZODone (DESYREL) 50 MG tablet Take 0.5-1 tablets (25-50 mg total) by mouth at bedtime as needed for sleep.  Marland Kitchen triamcinolone cream (KENALOG) 0.1 % Apply 1 application topically 2 (two) times daily.   No facility-administered encounter medications on file as of 03/17/2020.    Surgical History: Past Surgical History:  Procedure Laterality Date  . CESAREAN SECTION  05/12/2016  . WISDOM TOOTH EXTRACTION  2013   g/a    Medical History: Past Medical History:  Diagnosis Date  . Bacterial vaginosis 03/2016   being treated, dosnot remember med name    Family History: Family History  Problem Relation Age of Onset  . Hypertension Maternal Grandmother   . Cancer Maternal Grandfather   . Healthy Mother   . Cirrhosis Father     Social History   Socioeconomic History   . Marital status: Single    Spouse name: Not on file  . Number of children: Not on file  . Years of education: Not on file  . Highest education level: Not on file  Occupational History  . Not on file  Tobacco Use  . Smoking status: Never Smoker  . Smokeless tobacco: Never Used  Vaping Use  . Vaping Use: Never used  Substance and Sexual Activity  . Alcohol use: Not Currently  . Drug use: No  . Sexual activity: Yes  Other Topics Concern  . Not on file  Social History Narrative  . Not on file   Social Determinants of Health   Financial Resource Strain:   . Difficulty of Paying Living Expenses: Not on file  Food Insecurity:   . Worried About Programme researcher, broadcasting/film/video in the Last Year: Not on file  . Ran Out of Food in the Last Year: Not on file  Transportation Needs:   . Lack of Transportation (Medical): Not on file  . Lack of Transportation (Non-Medical): Not on file  Physical Activity:   . Days of Exercise per Week: Not on file  . Minutes of Exercise per Session: Not on file  Stress:   . Feeling of Stress : Not on file  Social Connections:   . Frequency of Communication with Friends and Family: Not on file  . Frequency of Social  Gatherings with Friends and Family: Not on file  . Attends Religious Services: Not on file  . Active Member of Clubs or Organizations: Not on file  . Attends Banker Meetings: Not on file  . Marital Status: Not on file  Intimate Partner Violence:   . Fear of Current or Ex-Partner: Not on file  . Emotionally Abused: Not on file  . Physically Abused: Not on file  . Sexually Abused: Not on file      Review of Systems  Constitutional: Negative for chills, diaphoresis and fatigue.  HENT: Negative for ear pain, postnasal drip and sinus pressure.   Eyes: Negative for photophobia, discharge, redness, itching and visual disturbance.  Respiratory: Negative for cough, shortness of breath and wheezing.   Cardiovascular: Negative for chest  pain, palpitations and leg swelling.  Gastrointestinal: Negative for abdominal pain, constipation, diarrhea, nausea and vomiting.  Genitourinary: Negative for dysuria and flank pain.  Musculoskeletal: Positive for arthralgias. Negative for back pain, gait problem and neck pain.  Skin: Positive for rash. Negative for color change.  Allergic/Immunologic: Negative for environmental allergies and food allergies.  Neurological: Negative for dizziness and headaches.  Hematological: Does not bruise/bleed easily.  Psychiatric/Behavioral: Negative for agitation, behavioral problems (depression) and hallucinations.    Vital Signs: BP 100/70   Pulse 75   Temp 97.9 F (36.6 C)   Resp 16   Ht 5\' 6"  (1.676 m)   Wt 163 lb 3.2 oz (74 kg)   SpO2 99%   BMI 26.34 kg/m    Physical Exam Constitutional:      General: She is not in acute distress.    Appearance: She is well-developed. She is not diaphoretic.  HENT:     Head: Normocephalic and atraumatic.     Mouth/Throat:     Pharynx: No oropharyngeal exudate.  Eyes:     Pupils: Pupils are equal, round, and reactive to light.  Neck:     Thyroid: No thyromegaly.     Vascular: No JVD.     Trachea: No tracheal deviation.  Cardiovascular:     Rate and Rhythm: Normal rate and regular rhythm.     Heart sounds: Normal heart sounds. No murmur heard.  No friction rub. No gallop.   Pulmonary:     Effort: Pulmonary effort is normal. No respiratory distress.     Breath sounds: No wheezing or rales.  Chest:     Chest wall: No tenderness.  Musculoskeletal:        General: Normal range of motion.     Cervical back: Normal range of motion and neck supple.  Lymphadenopathy:     Cervical: No cervical adenopathy.  Skin:    General: Skin is warm and dry.     Findings: No erythema, lesion or rash.     Comments: Scar at the chin after biopsy   Neurological:     Mental Status: She is alert and oriented to person, place, and time.     Cranial Nerves: No  cranial nerve deficit.  Psychiatric:        Behavior: Behavior normal.        Thought Content: Thought content normal.        Judgment: Judgment normal.    Assessment/Plan: 1. H/O discoid lupus erythematosus No clear diagnosis for this condition, no rash no active inflammation at this time, pt is referred to Rheumatology for proper dx and treatment, a work note is given until pt is seen by specialist for proper evaluation  and treatment   - Ambulatory referral to Rheumatology  2. Arthralgia, unspecified joint Stable at this time   3. Eye exam due to high risk medication, encounter for Pt has been on plaquenil, will arrange for eye exam   - Ambulatory referral to Ophthalmology  General Counseling: arbor leer understanding of the findings of todays visit and agrees with plan of treatment. I have discussed any further diagnostic evaluation that may be needed or ordered today. We also reviewed her medications today. she has been encouraged to call the office with any questions or concerns that should arise related to todays visit.  Orders Placed This Encounter  Procedures  . Ambulatory referral to Rheumatology  . Ambulatory referral to Ophthalmology    Total time spent:30 Minutes Time spent includes review of chart, medications, test results, and follow up plan with the patient.   Dr Lyndon Code Internal medicine

## 2020-03-24 ENCOUNTER — Ambulatory Visit: Admitting: Hospice and Palliative Medicine

## 2020-04-07 ENCOUNTER — Telehealth: Payer: Self-pay

## 2020-04-07 ENCOUNTER — Ambulatory Visit: Admitting: Hospice and Palliative Medicine

## 2020-04-07 NOTE — Telephone Encounter (Signed)
Lmom to rs missed ov on 04-07-20. Toni Amend

## 2020-05-21 ENCOUNTER — Other Ambulatory Visit (HOSPITAL_COMMUNITY): Payer: Self-pay | Admitting: Cardiology

## 2020-05-21 ENCOUNTER — Other Ambulatory Visit: Payer: Self-pay | Admitting: Cardiology

## 2020-05-22 ENCOUNTER — Other Ambulatory Visit: Payer: Self-pay | Admitting: Specialist

## 2020-05-22 DIAGNOSIS — M25851 Other specified joint disorders, right hip: Secondary | ICD-10-CM

## 2020-05-28 ENCOUNTER — Ambulatory Visit (INDEPENDENT_AMBULATORY_CARE_PROVIDER_SITE_OTHER): Admitting: Hospice and Palliative Medicine

## 2020-05-28 ENCOUNTER — Other Ambulatory Visit: Payer: Self-pay

## 2020-05-28 ENCOUNTER — Encounter: Payer: Self-pay | Admitting: Hospice and Palliative Medicine

## 2020-05-28 VITALS — BP 105/76 | HR 92 | Temp 97.8°F | Resp 16 | Ht 66.0 in | Wt 163.0 lb

## 2020-05-28 DIAGNOSIS — F411 Generalized anxiety disorder: Secondary | ICD-10-CM

## 2020-05-28 DIAGNOSIS — Z862 Personal history of diseases of the blood and blood-forming organs and certain disorders involving the immune mechanism: Secondary | ICD-10-CM

## 2020-05-28 DIAGNOSIS — Z124 Encounter for screening for malignant neoplasm of cervix: Secondary | ICD-10-CM | POA: Diagnosis not present

## 2020-05-28 DIAGNOSIS — Z0001 Encounter for general adult medical examination with abnormal findings: Secondary | ICD-10-CM | POA: Diagnosis not present

## 2020-05-28 DIAGNOSIS — M2550 Pain in unspecified joint: Secondary | ICD-10-CM | POA: Diagnosis not present

## 2020-05-28 DIAGNOSIS — Z872 Personal history of diseases of the skin and subcutaneous tissue: Secondary | ICD-10-CM

## 2020-05-28 DIAGNOSIS — F5101 Primary insomnia: Secondary | ICD-10-CM

## 2020-05-28 NOTE — Progress Notes (Signed)
[facility] [address] [place], [place] [address]  Internal MEDICINE  Office Visit Note  Patient Name: Yvonne Christensen  [id number]  [id number]  Date of Service: 05/31/2020  Chief Complaint  Patient presents with  . Annual Exam  . Headache    Since she got covid shot     HPI Pt is here for routine health maintenance examination Since last visit she has been to see Rheumatology, diagnosed her with Sjogren's disease At that time she was not having any symptoms that were concerning for RA--recommended follow-up with any acute flares and may consider Methotrexate treatment, Plaquenil has been discontinued Had eye exam, prescribed her reading glasses Rheumatology ordered labs--Sjogren's Anti-AA-A elevated, C3 and C4 normal  Feels as though on increased dose of Lexapro-20 mg she is having increased agitation and irritation Anxiety and depression are more controlled but she feels on edge, felt better on 10 mg  Sleeping better, trazodone is helping with her insomnia  Current Medication: Outpatient Encounter Medications as of 05/28/2020  Medication Sig  . fluconazole (DIFLUCAN) 150 MG tablet Take 1 tablet (150 mg total) by mouth every 3 (three) days.  . traZODone (DESYREL) 50 MG tablet Take 0.5-1 tablets (25-50 mg total) by mouth at bedtime as needed for sleep.  Marland Kitchen triamcinolone cream (KENALOG) 0.1 % Apply 1 application topically 2 (two) times daily.  . [DISCONTINUED] escitalopram (LEXAPRO) 20 MG tablet Take 0.5 tablets (10 mg total) by mouth daily.   No facility-administered encounter medications on file as of 05/28/2020.    Surgical History: Past Surgical History:  Procedure Laterality Date  . CESAREAN SECTION  05/12/2016  . WISDOM TOOTH EXTRACTION  2013   g/a    Medical History: Past Medical History:  Diagnosis Date  . Bacterial vaginosis 03/2016   being treated, dosnot remember med name    Family History: Family History  Problem Relation Age of Onset   . Hypertension Maternal Grandmother   . Cancer Maternal Grandfather   . Healthy Mother   . Cirrhosis Father       Review of Systems  Constitutional: Negative for chills, diaphoresis and fatigue.  HENT: Negative for ear pain, postnasal drip and sinus pressure.   Eyes: Negative for photophobia, discharge, redness, itching and visual disturbance.  Respiratory: Negative for cough, shortness of breath and wheezing.   Cardiovascular: Negative for chest pain, palpitations and leg swelling.  Gastrointestinal: Negative for abdominal pain, constipation, diarrhea, nausea and vomiting.  Genitourinary: Negative for dysuria and flank pain.  Musculoskeletal: Negative for arthralgias, back pain, gait problem and neck pain.  Skin: Negative for color change.  Allergic/Immunologic: Negative for environmental allergies and food allergies.  Neurological: Negative for dizziness and headaches.  Hematological: Does not bruise/bleed easily.  Psychiatric/Behavioral: Negative for agitation, behavioral problems (depression) and hallucinations.       Irritable, easily agitated    Vital Signs: BP 105/76   Pulse 92   Temp 97.8 F (36.6 C)   Resp 16   Ht 5\' 6"  (1.676 m)   Wt 163 lb (73.9 kg)   SpO2 97%   BMI 26.31 kg/m    Physical Exam Vitals reviewed.  Constitutional:      Appearance: Normal appearance. She is well-developed and normal weight.  HENT:     Head: Normocephalic.     Right Ear: Tympanic membrane normal.     Left Ear: Tympanic membrane normal.     Nose: Nose normal.     Mouth/Throat:     Mouth: Mucous  membranes are moist.     Pharynx: Oropharynx is clear.  Eyes:     Pupils: Pupils are equal, round, and reactive to light.  Cardiovascular:     Rate and Rhythm: Normal rate and regular rhythm.     Pulses: Normal pulses.     Heart sounds: Normal heart sounds.  Pulmonary:     Effort: Pulmonary effort is normal.     Breath sounds: Normal breath sounds.  Chest:  Breasts:     Right:  Normal.     Left: Normal.    Abdominal:     General: Abdomen is flat.     Palpations: Abdomen is soft.  Musculoskeletal:        General: Normal range of motion.     Cervical back: Normal range of motion.  Skin:    General: Skin is warm.  Neurological:     General: No focal deficit present.     Mental Status: She is alert and oriented to person, place, and time. Mental status is at baseline.  Psychiatric:        Mood and Affect: Mood normal.        Behavior: Behavior normal.        Thought Content: Thought content normal.        Judgment: Judgment normal.      LABS: Recent Results (from the past 2160 hour(s))  UA/M w/rflx Culture, Routine     Status: None   Collection Time: 05/28/20  9:40 AM   Specimen: Urine   Urine  Result Value Ref Range   Specific Gravity, UA 1.023 1.005 - 1.030   pH, UA 6.5 5.0 - 7.5   Color, UA Yellow Yellow   Appearance Ur Clear Clear   Leukocytes,UA Negative Negative   Protein,UA Negative Negative/Trace   Glucose, UA Negative Negative   Ketones, UA Negative Negative   RBC, UA Negative Negative   Bilirubin, UA Negative Negative   Urobilinogen, Ur 0.2 0.2 - 1.0 mg/dL   Nitrite, UA Negative Negative   Microscopic Examination Comment     Comment: Microscopic follows if indicated.   Microscopic Examination See below:     Comment: Microscopic was indicated and was performed.   Urinalysis Reflex Comment     Comment: This specimen will not reflex to a Urine Culture.  Microscopic Examination     Status: None   Collection Time: 05/28/20  9:40 AM   Urine  Result Value Ref Range   WBC, UA 0-5 0 - 5 /hpf   RBC None seen 0 - 2 /hpf   Epithelial Cells (non renal) 0-10 0 - 10 /hpf   Casts None seen None seen /lpf   Bacteria, UA None seen None seen/Few    Assessment/Plan: 1. Encounter for well adult exam with abnormal findings Well appearing 25 year old AA female Labs reviewed from earlier this year--stable - UA/M w/rflx Culture, Routine -  Microscopic Examination  2. H/O discoid lupus erythematosus Symptoms controlled at this time, continue to follow-up with dermatology, encouraged to follow-up with rheumatology as well  3. Arthralgia, unspecified joint Symptoms controlled at this time, advised to follow-up with rheumatology with further acute flares  4. Primary insomnia Continues to be well controlled with trazodone, continue to monitor  5. GAD (generalized anxiety disorder) Decrease dose back down to 10 mg due to increased agitation on 20 mg Will assess response to lower dose, may need to consider other therapy options//consider Zoloft  General Counseling: Zanyah verbalizes understanding of the findings  of todays visit and agrees with plan of treatment. I have discussed any further diagnostic evaluation that may be needed or ordered today. We also reviewed her medications today. she has been encouraged to call the office with any questions or concerns that should arise related to todays visit.    Counseling:    Orders Placed This Encounter  Procedures  . Microscopic Examination  . UA/M w/rflx Culture, Routine      Total time spent: 30 Minutes  Time spent includes review of chart, medications, test results, and follow up plan with the patient.   This patient was seen by Brent General AGNP-C Collaboration with Dr Lyndon Code as a part of collaborative care agreement   Lubertha Basque. [facility] Internal Medicine

## 2020-05-29 LAB — MICROSCOPIC EXAMINATION
Bacteria, UA: NONE SEEN
Casts: NONE SEEN /lpf
RBC, Urine: NONE SEEN /hpf (ref 0–2)

## 2020-05-29 LAB — UA/M W/RFLX CULTURE, ROUTINE
Bilirubin, UA: NEGATIVE
Glucose, UA: NEGATIVE
Ketones, UA: NEGATIVE
Leukocytes,UA: NEGATIVE
Nitrite, UA: NEGATIVE
Protein,UA: NEGATIVE
RBC, UA: NEGATIVE
Specific Gravity, UA: 1.023 (ref 1.005–1.030)
Urobilinogen, Ur: 0.2 mg/dL (ref 0.2–1.0)
pH, UA: 6.5 (ref 5.0–7.5)

## 2020-05-31 ENCOUNTER — Encounter: Payer: Self-pay | Admitting: Hospice and Palliative Medicine

## 2020-06-01 ENCOUNTER — Ambulatory Visit

## 2020-06-01 ENCOUNTER — Ambulatory Visit: Admission: RE | Admit: 2020-06-01 | Source: Ambulatory Visit

## 2020-06-16 ENCOUNTER — Ambulatory Visit
Admission: RE | Admit: 2020-06-16 | Discharge: 2020-06-16 | Disposition: A | Discharge status: Home / Self Care | Source: Ambulatory Visit | Attending: Specialist | Admitting: Specialist

## 2020-06-16 ENCOUNTER — Other Ambulatory Visit: Payer: Self-pay

## 2020-06-16 DIAGNOSIS — M25851 Other specified joint disorders, right hip: Secondary | ICD-10-CM

## 2020-06-16 MED ORDER — GADOBUTROL 1 MMOL/ML IV SOLN
0.0500 mL | Freq: Once | INTRAVENOUS | Status: AC | PRN
  Administered 2020-06-16: 0.05 mL

## 2020-06-16 MED ORDER — IOHEXOL 180 MG/ML  SOLN
20.0000 mL | Freq: Once | INTRAMUSCULAR | Status: AC | PRN
  Administered 2020-06-16: 20 mL

## 2020-06-16 MED ORDER — SODIUM CHLORIDE (PF) 0.9 % IJ SOLN
10.0000 mL | INTRAMUSCULAR | Status: DC | PRN
  Administered 2020-06-16: 10 mL

## 2020-06-16 MED ORDER — LIDOCAINE HCL (PF) 1 % IJ SOLN
5.0000 mL | Freq: Once | INTRAMUSCULAR | Status: AC
  Administered 2020-06-16: 5 mL via INTRADERMAL
  Filled 2020-06-16: qty 5

## 2020-08-21 ENCOUNTER — Ambulatory Visit (LOCAL_COMMUNITY_HEALTH_CENTER): Payer: Self-pay

## 2020-08-21 ENCOUNTER — Other Ambulatory Visit: Payer: Self-pay

## 2020-08-21 DIAGNOSIS — Z111 Encounter for screening for respiratory tuberculosis: Secondary | ICD-10-CM

## 2020-08-21 NOTE — Progress Notes (Signed)
Client did not report Latex as an allergy. Jossie Ng, RN

## 2020-08-24 ENCOUNTER — Other Ambulatory Visit: Payer: Medicaid Other

## 2020-08-25 ENCOUNTER — Other Ambulatory Visit: Payer: Medicaid Other

## 2020-09-01 ENCOUNTER — Ambulatory Visit: Admitting: Hospice and Palliative Medicine

## 2020-10-19 ENCOUNTER — Other Ambulatory Visit: Payer: Medicaid Other

## 2020-11-20 ENCOUNTER — Other Ambulatory Visit: Payer: Medicaid Other

## 2020-11-23 ENCOUNTER — Encounter: Admitting: Nurse Practitioner

## 2020-11-26 ENCOUNTER — Other Ambulatory Visit: Payer: Self-pay

## 2020-11-26 ENCOUNTER — Ambulatory Visit (INDEPENDENT_AMBULATORY_CARE_PROVIDER_SITE_OTHER): Admitting: Internal Medicine

## 2020-11-26 ENCOUNTER — Encounter: Payer: Self-pay | Admitting: Internal Medicine

## 2020-11-26 VITALS — BP 112/80 | HR 81 | Temp 97.5°F | Resp 16 | Ht 66.0 in | Wt 163.0 lb

## 2020-11-26 DIAGNOSIS — Z111 Encounter for screening for respiratory tuberculosis: Secondary | ICD-10-CM

## 2020-11-26 DIAGNOSIS — Z113 Encounter for screening for infections with a predominantly sexual mode of transmission: Secondary | ICD-10-CM

## 2020-11-26 DIAGNOSIS — L409 Psoriasis, unspecified: Secondary | ICD-10-CM

## 2020-11-26 DIAGNOSIS — L308 Other specified dermatitis: Secondary | ICD-10-CM

## 2020-11-26 DIAGNOSIS — M3501 Sicca syndrome with keratoconjunctivitis: Secondary | ICD-10-CM

## 2020-11-26 DIAGNOSIS — Z23 Encounter for immunization: Secondary | ICD-10-CM

## 2020-11-26 DIAGNOSIS — E538 Deficiency of other specified B group vitamins: Secondary | ICD-10-CM | POA: Diagnosis not present

## 2020-11-26 DIAGNOSIS — Z0001 Encounter for general adult medical examination with abnormal findings: Secondary | ICD-10-CM

## 2020-11-26 DIAGNOSIS — R300 Dysuria: Secondary | ICD-10-CM | POA: Diagnosis not present

## 2020-11-26 MED ORDER — PNEUMOCOCCAL 20-VAL CONJ VACC 0.5 ML IM SUSY: 0.5000 mL | PREFILLED_SYRINGE | INTRAMUSCULAR | 0 refills | Status: AC

## 2020-11-26 MED ORDER — TRIAMCINOLONE ACETONIDE 0.1 % EX CREA: 1.0000 "application " | TOPICAL_CREAM | Freq: Two times a day (BID) | CUTANEOUS | 3 refills | Status: DC

## 2020-11-26 NOTE — Progress Notes (Signed)
[facility] [address] [place], [place] [address]  Internal MEDICINE  Office Visit Note  Patient Name: Yvonne Christensen  [id number]  [id number]  Date of Service: 11/29/2020  Chief Complaint  Patient presents with   Annual Exam    STD testing, paperwork, refills, discuss yeast infections and BV, TB testing     HPI Pt is here for routine health maintenance examination She is complaining of having bacterial vaginosis or vulvovaginitis and will like to be treated however unable to do a Pap smear today she also would like to be tested for STD. Patient does have a diagnosis of Sjogren's syndrome recently.  Complains of skin lupus dry mouth and positive SSA she was unable to tolerate Plaquenil right now she is taking Biotene for dry mouth and drinking plenty of water and pilocarpine was also given for dry mouth. 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 No chest pain or shortness of breath Current Medication: Outpatient Encounter Medications as of 11/26/2020  Medication Sig   traZODone (DESYREL) 50 MG tablet Take 0.5-1 tablets (25-50 mg total) by mouth at bedtime as needed for sleep.   [DISCONTINUED] pneumococcal 20-Val Conj Vacc (PREVNAR 20) 0.5 ML SUSY Inject 0.5 mLs into the muscle tomorrow at 10 am.   [DISCONTINUED] triamcinolone cream (KENALOG) 0.1 % Apply 1 application topically 2 (two) times daily.   [EXPIRED] pneumococcal 20-Val Conj Vacc (PREVNAR 20) 0.5 ML SUSY Inject 0.5 mLs into the muscle tomorrow at 10 am for 1 dose.   triamcinolone cream (KENALOG) 0.1 % Apply 1 application topically 2 (two) times daily.   [DISCONTINUED] fluconazole (DIFLUCAN) 150 MG tablet Take 1 tablet (150 mg total) by mouth every 3 (three) days.   No facility-administered encounter medications on file as of 11/26/2020.    Surgical History: Past Surgical History:  Procedure Laterality Date   CESAREAN SECTION  05/12/2016   WISDOM TOOTH  EXTRACTION  2013   g/a    Medical History: Past Medical History:  Diagnosis Date   Bacterial vaginosis 03/2016   being treated, dosnot remember med name   Lupus (HCC)    Sjogren's syndrome (HCC)     Family History: Family History  Problem Relation Age of Onset   Cancer Mother    Healthy Mother    Breast cancer Mother    Cirrhosis Father    Hypertension Maternal Grandmother    Cancer Maternal Grandfather     Social History: Social History   Socioeconomic History   Marital status: Single    Spouse name: Not on file   Number of children: Not on file   Years of education: Not on file   Highest education level: Not on file  Occupational History   Not on file  Tobacco Use   Smoking status: Never   Smokeless tobacco: Never  Vaping Use   Vaping Use: Never used  Substance and Sexual Activity   Alcohol use: Not Currently   Drug use: No   Sexual activity: Yes  Other Topics Concern   Not on file  Social History Narrative   Not on file   Social Determinants of Health   Financial Resource Strain: Not on file  Food Insecurity: Not on file  Transportation Needs: Not on file  Physical Activity: Not on file  Stress: Not on file  Social Connections: Not on file      Review of Systems  Constitutional:  Negative for chills,  fatigue and unexpected weight change.  HENT:  Negative for congestion, rhinorrhea, sneezing and sore throat.   Eyes:  Negative for redness.  Respiratory:  Negative for cough, chest tightness and shortness of breath.   Cardiovascular:  Negative for chest pain and palpitations.  Gastrointestinal:  Negative for abdominal pain, constipation, diarrhea, nausea and vomiting.  Genitourinary:  Negative for dysuria and frequency.  Musculoskeletal:  Negative for arthralgias, back pain, joint swelling and neck pain.  Skin:  Positive for rash.  Neurological: Negative.  Negative for tremors and numbness.  Hematological:  Negative for adenopathy. Does not  bruise/bleed easily.  Psychiatric/Behavioral:  Negative for behavioral problems (Depression), sleep disturbance and suicidal ideas. The patient is not nervous/anxious.     Vital Signs: BP 112/80   Pulse 81   Temp (!) 97.5 F (36.4 C)   Resp 16   Ht 5\' 6"  (1.676 m)   Wt 163 lb (73.9 kg)   SpO2 99%   BMI 26.31 kg/m    Physical Exam Constitutional:      General: She is not in acute distress.    Appearance: She is well-developed. She is not diaphoretic.  HENT:     Head: Normocephalic and atraumatic.     Mouth/Throat:     Pharynx: No oropharyngeal exudate.  Eyes:     Pupils: Pupils are equal, round, and reactive to light.  Neck:     Thyroid: No thyromegaly.     Vascular: No JVD.     Trachea: No tracheal deviation.  Cardiovascular:     Rate and Rhythm: Normal rate and regular rhythm.     Heart sounds: Normal heart sounds. No murmur heard.   No friction rub. No gallop.  Pulmonary:     Effort: Pulmonary effort is normal. No respiratory distress.     Breath sounds: No wheezing or rales.  Chest:     Chest wall: No tenderness.  Abdominal:     General: Bowel sounds are normal.     Palpations: Abdomen is soft.  Musculoskeletal:        General: Normal range of motion.     Cervical back: Normal range of motion and neck supple.  Lymphadenopathy:     Cervical: No cervical adenopathy.  Skin:    General: Skin is warm and dry.  Neurological:     Mental Status: She is alert and oriented to person, place, and time.     Cranial Nerves: No cranial nerve deficit.  Psychiatric:        Behavior: Behavior normal.        Thought Content: Thought content normal.        Judgment: Judgment normal.     LABS: Recent Results (from the past 2160 hour(s))  UA/M w/rflx Culture, Routine     Status: Abnormal   Collection Time: 11/26/20  9:44 AM   Specimen: Urine   Urine  Result Value Ref Range   Specific Gravity, UA 1.028 1.005 - 1.030   pH, UA 5.5 5.0 - 7.5   Color, UA Yellow Yellow    Appearance Ur Turbid (A) Clear   Leukocytes,UA Negative Negative   Protein,UA Trace Negative/Trace   Glucose, UA Negative Negative   Ketones, UA Negative Negative   RBC, UA Negative Negative   Bilirubin, UA Negative Negative   Urobilinogen, Ur 0.2 0.2 - 1.0 mg/dL   Nitrite, UA Negative Negative   Microscopic Examination Comment     Comment: Microscopic follows if indicated.   Microscopic Examination See below:  Comment: Microscopic was indicated and was performed.   Urinalysis Reflex Comment     Comment: This specimen will not reflex to a Urine Culture.  Microscopic Examination     Status: None   Collection Time: 11/26/20  9:44 AM   Urine  Result Value Ref Range   WBC, UA None seen 0 - 5 /hpf   RBC None seen 0 - 2 /hpf   Epithelial Cells (non renal) 0-10 0 - 10 /hpf   Casts None seen None seen /lpf   Bacteria, UA Few None seen/Few     Assessment/Plan: 1. Encounter for general adult medical examination with abnormal findings All age-appropriate health maintenance is updated - B12 and Folate Panel  2. B12 deficiency Patient had borderline B12 level we will monitor - B12 and Folate Panel  3. Dysuria Urine is sent for STD - UA/M w/rflx Culture, Routine - B12 and Folate Panel - Microscopic Examination  4. Sjogren syndrome with keratoconjunctivitis (HCC) Followed by rheumatology - B12 and Folate Panel  5. Screening for tuberculosis TB test is done - TB Skin Test  6. Encounter for screening examination for sexually transmitted disease Sample sent to the lab - Chlamydia/Gonococcus/Trichomonas, NAA  7. Other eczema For topical symptoms of lupus patient is sent - triamcinolone cream (KENALOG) 0.1 %; Apply 1 application topically 2 (two) times daily.  Dispense: 45 g; Refill: 3  8. Need for vaccination Pneumococcal vaccination is sent as well - pneumococcal 20-Val Conj Vacc (PREVNAR 20) 0.5 ML SUSY; Inject 0.5 mLs into the muscle tomorrow at 10 am for 1 dose.   Dispense: 0.5 mL; Refill: 0   General Counseling: Tira verbalizes understanding of the findings of todays visit and agrees with plan of treatment. I have discussed any further diagnostic evaluation that may be needed or ordered today. We also reviewed her medications today. she has been encouraged to call the office with any questions or concerns that should arise related to todays visit.    Counseling:  [place] Controlled Substance Database was reviewed by me.  Orders Placed This Encounter  Procedures   Chlamydia/Gonococcus/Trichomonas, NAA   Microscopic Examination   UA/M w/rflx Culture, Routine   B12 and Folate Panel   TB Skin Test    Meds ordered this encounter  Medications   pneumococcal 20-Val Conj Vacc (PREVNAR 20) 0.5 ML SUSY    Sig: Inject 0.5 mLs into the muscle tomorrow at 10 am for 1 dose.    Dispense:  0.5 mL    Refill:  0   triamcinolone cream (KENALOG) 0.1 %    Sig: Apply 1 application topically 2 (two) times daily.    Dispense:  45 g    Refill:  3    Total time spent:35 Minutes  Time spent includes review of chart, medications, test results, and follow up plan with the patient.     Lyndon Code, MD  Internal Medicine

## 2020-11-27 ENCOUNTER — Telehealth: Payer: Self-pay

## 2020-11-27 LAB — MICROSCOPIC EXAMINATION
Casts: NONE SEEN /lpf
RBC, Urine: NONE SEEN /hpf (ref 0–2)
WBC, UA: NONE SEEN /hpf (ref 0–5)

## 2020-11-27 LAB — UA/M W/RFLX CULTURE, ROUTINE
Bilirubin, UA: NEGATIVE
Glucose, UA: NEGATIVE
Ketones, UA: NEGATIVE
Leukocytes,UA: NEGATIVE
Nitrite, UA: NEGATIVE
RBC, UA: NEGATIVE
Specific Gravity, UA: 1.028 (ref 1.005–1.030)
Urobilinogen, Ur: 0.2 mg/dL (ref 0.2–1.0)
pH, UA: 5.5 (ref 5.0–7.5)

## 2020-11-27 NOTE — Telephone Encounter (Signed)
Called [facility] and add std order [id number]

## 2020-12-03 LAB — TB SKIN TEST
Induration: 0 mm
TB Skin Test: NEGATIVE

## 2020-12-04 ENCOUNTER — Telehealth: Payer: Self-pay

## 2020-12-04 NOTE — Telephone Encounter (Signed)
Patient called requesting to pick up lab results. Printed and @ front desk to be picked up-Toni

## 2020-12-04 NOTE — Telephone Encounter (Signed)
Called [facility] and they are faxing over results for STD testing

## 2020-12-10 LAB — SPECIMEN STATUS REPORT

## 2020-12-10 LAB — CHLAMYDIA/GONOCOCCUS/TRICHOMONAS, NAA

## 2020-12-24 ENCOUNTER — Ambulatory Visit: Payer: Self-pay

## 2021-02-24 ENCOUNTER — Encounter: Payer: Self-pay | Admitting: Nurse Practitioner

## 2021-02-24 ENCOUNTER — Other Ambulatory Visit: Payer: Self-pay

## 2021-02-24 ENCOUNTER — Ambulatory Visit (INDEPENDENT_AMBULATORY_CARE_PROVIDER_SITE_OTHER): Admitting: Nurse Practitioner

## 2021-02-24 VITALS — BP 98/68 | HR 77 | Temp 98.5°F | Resp 16 | Ht 66.0 in | Wt 165.2 lb

## 2021-02-24 DIAGNOSIS — F411 Generalized anxiety disorder: Secondary | ICD-10-CM | POA: Diagnosis not present

## 2021-02-24 DIAGNOSIS — N898 Other specified noninflammatory disorders of vagina: Secondary | ICD-10-CM | POA: Diagnosis not present

## 2021-02-24 MED ORDER — ESCITALOPRAM OXALATE 20 MG PO TABS: 20.0000 mg | ORAL_TABLET | Freq: Every day | ORAL | 1 refills | Status: DC

## 2021-02-24 NOTE — Progress Notes (Signed)
[facility] [address] [place], [place] [address]  Internal MEDICINE  Office Visit Note  Patient Name: Yvonne Christensen  [id number]  [id number]  Date of Service: 02/24/2021  Chief Complaint  Patient presents with   Acute Visit    Bump on inside of labia, lasted 2 days     HPI Yvonne Christensen presents for an acute sick visit for a bump on the inside of the labia. It was there for 2 days and then it went away. When she would wipe it would drain clear fluid. Although it has resolved, she did bring a photo of the bump.  She is also requesting a refill of lexapro.    Current Medication:  Outpatient Encounter Medications as of 02/24/2021  Medication Sig   traZODone (DESYREL) 50 MG tablet Take 0.5-1 tablets (25-50 mg total) by mouth at bedtime as needed for sleep.   triamcinolone cream (KENALOG) 0.1 % Apply 1 application topically 2 (two) times daily.   escitalopram (LEXAPRO) 20 MG tablet Take 1 tablet (20 mg total) by mouth daily.   [DISCONTINUED] escitalopram (LEXAPRO) 20 MG tablet Take by mouth.   No facility-administered encounter medications on file as of 02/24/2021.      Medical History: Past Medical History:  Diagnosis Date   Bacterial vaginosis 03/2016   being treated, dosnot remember med name   Lupus (HCC)    Sjogren's syndrome (HCC)      Vital Signs: BP 98/68   Pulse 77   Temp 98.5 F (36.9 C)   Resp 16   Ht 5\' 6"  (1.676 m)   Wt 165 lb 3.2 oz (74.9 kg)   SpO2 98%   BMI 26.66 kg/m    Review of Systems  Constitutional:  Negative for chills, fatigue and unexpected weight change.  HENT:  Negative for congestion, rhinorrhea, sneezing and sore throat.   Eyes:  Negative for redness.  Respiratory:  Negative for cough, chest tightness and shortness of breath.   Cardiovascular:  Negative for chest pain and palpitations.  Gastrointestinal:  Negative for abdominal pain, constipation, diarrhea, nausea and vomiting.  Genitourinary:  Negative for dysuria  and frequency.       Bump on labia that has resolved, it was not tender or painful. It did drain a clear fluid.   Musculoskeletal:  Negative for arthralgias, back pain, joint swelling and neck pain.  Skin:  Negative for rash.  Neurological: Negative.  Negative for tremors and numbness.  Hematological:  Negative for adenopathy. Does not bruise/bleed easily.  Psychiatric/Behavioral:  Negative for behavioral problems (Depression), sleep disturbance and suicidal ideas. The patient is not nervous/anxious.    Physical Exam Vitals reviewed.  Constitutional:      General: She is not in acute distress.    Appearance: Normal appearance. She is normal weight. She is not ill-appearing.  HENT:     Head: Normocephalic and atraumatic.  Eyes:     Extraocular Movements: Extraocular movements intact.     Pupils: Pupils are equal, round, and reactive to light.  Cardiovascular:     Rate and Rhythm: Normal rate and regular rhythm.  Neurological:     Mental Status: She is alert and oriented to person, place, and time.     Cranial Nerves: No cranial nerve deficit.     Coordination: Coordination normal.     Gait: Gait normal.  Psychiatric:        Mood and Affect: Mood normal.        Behavior: Behavior normal.  Assessment/Plan: 1. Vaginal mucocele Resolved, encouraged patient to call if it reoccurs and she can come into the clinic   2. Generalized anxiety disorder Stable with escitalopram, continue as prescribed.  - escitalopram (LEXAPRO) 20 MG tablet; Take 1 tablet (20 mg total) by mouth daily.  Dispense: 90 tablet; Refill: 1   General Counseling: Yvonne Christensen verbalizes understanding of the findings of todays visit and agrees with plan of treatment. I have discussed any further diagnostic evaluation that may be needed or ordered today. We also reviewed her medications today. she has been encouraged to call the office with any questions or concerns that should arise related to todays  visit.    Counseling:    No orders of the defined types were placed in this encounter.   Meds ordered this encounter  Medications   escitalopram (LEXAPRO) 20 MG tablet    Sig: Take 1 tablet (20 mg total) by mouth daily.    Dispense:  90 tablet    Refill:  1    Return in 3 months (on 05/31/2021) for CPE, Yvonne Christensen PCP.  [place] Controlled Substance Database was reviewed by me for overdose risk score (ORS)  Time spent:15 Minutes Time spent with patient included reviewing progress notes, labs, imaging studies, and discussing plan for follow up.   This patient was seen by Sallyanne Kuster, FNP-C in collaboration with Dr. Beverely Risen as a part of collaborative care agreement.  Yvonne Christensen R. Tedd Sias, MSN, FNP-C Internal Medicine

## 2021-03-24 ENCOUNTER — Ambulatory Visit: Admitting: Nurse Practitioner

## 2021-03-24 DIAGNOSIS — Z0289 Encounter for other administrative examinations: Secondary | ICD-10-CM

## 2021-04-20 ENCOUNTER — Telehealth: Payer: Self-pay

## 2021-04-20 NOTE — Telephone Encounter (Signed)
Completed medical records for [facility]. Faxed payment request of $42.50 to [phone]. Mailed records to  [facility]  [address] [place] [address]

## 2021-05-31 ENCOUNTER — Other Ambulatory Visit: Admitting: Nurse Practitioner

## 2021-06-01 ENCOUNTER — Ambulatory Visit (INDEPENDENT_AMBULATORY_CARE_PROVIDER_SITE_OTHER): Admitting: Nurse Practitioner

## 2021-06-01 ENCOUNTER — Telehealth: Payer: Self-pay

## 2021-06-01 ENCOUNTER — Other Ambulatory Visit: Payer: Self-pay

## 2021-06-01 ENCOUNTER — Encounter: Payer: Self-pay | Admitting: Nurse Practitioner

## 2021-06-01 VITALS — BP 102/80 | HR 68 | Temp 98.1°F | Ht 66.0 in | Wt 159.2 lb

## 2021-06-01 DIAGNOSIS — R4586 Emotional lability: Secondary | ICD-10-CM | POA: Diagnosis not present

## 2021-06-01 DIAGNOSIS — F419 Anxiety disorder, unspecified: Secondary | ICD-10-CM | POA: Insufficient documentation

## 2021-06-01 DIAGNOSIS — Z1159 Encounter for screening for other viral diseases: Secondary | ICD-10-CM | POA: Diagnosis not present

## 2021-06-01 DIAGNOSIS — Z7689 Persons encountering health services in other specified circumstances: Secondary | ICD-10-CM

## 2021-06-01 DIAGNOSIS — F320 Major depressive disorder, single episode, mild: Secondary | ICD-10-CM

## 2021-06-01 NOTE — Telephone Encounter (Signed)
Left voicemail for patient to call and reschedule missed office visit from 05-31-21.

## 2021-06-01 NOTE — Patient Instructions (Addendum)
Be expecting a call from [facility] for the medication genetic screen.   Ashwaganda for anxiety and sleep Magnesium at least 200 mg with evening meal. Allow at least 2 weeks for lexapro to take effect.

## 2021-06-01 NOTE — Progress Notes (Signed)
I,Victoria T Hamilton,acting as a Neurosurgeon for Arnette Felts, FNP.,have documented all relevant documentation on the behalf of Arnette Felts, FNP,as directed by  Arnette Felts, FNP while in the presence of Arnette Felts, FNP.  This visit occurred during the SARS-CoV-2 public health emergency.  Safety protocols were in place, including screening questions prior to the visit, additional usage of staff PPE, and extensive cleaning of exam room while observing appropriate contact time as indicated for disinfecting solutions.  Subjective:     Patient ID: Yvonne Christensen , female    DOB: 1996-04-18 , 26 y.o.   MRN: [id number]   Chief Complaint  Patient presents with   Establish Care    HPI  Pt here today to establish care. She found [place] online. She is in the [employer]  Single.  She has a daughter who is 3 years old - healthy.  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 Previous PCP : Dr Welton Flakes [facility] - she was going to [place] - last seen couple months ago.    Pt reports lexapro in 2021, she was being treated for anxiety, she is feeling "woozy and nauseated". med makes her feel nauseous. She will take less than 2 weeks and will stop. She gets agitated quickly. She had this problem throughout her whole life. She hs been to therapy in the past but stopped going but feels she may need to go back. Tracy Piore.   2019 - diagnosed with Lupou and Sjorgren's syndrome. She is treated at [facility]. She is on Plaquenil and pilocarpine and a cream she sees [facility] in [place].  She has a Armed forces operational officer in [place] - she lives off [address]. She sees her every 6 months.     Past Medical History:  Diagnosis Date   Bacterial vaginosis 03/2016   being treated, dosnot remember med name   Decreased fetal movement in pregnancy, antepartum 04/23/2016   Leakage of amniotic fluid 03/25/2016   Lupus (HCC)    Sjogren's syndrome (HCC)      Family History  Problem Relation Age of Onset   Breast cancer Mother     Bipolar disorder Mother    Cirrhosis Father    ADD / ADHD Sister    Hypertension Maternal Grandmother    Prostate cancer Maternal Grandfather    Bipolar disorder Paternal Grandmother      Current Outpatient Medications:    escitalopram (LEXAPRO) 20 MG tablet, Take 1 tablet (20 mg total) by mouth daily., Disp: 90 tablet, Rfl: 1   traZODone (DESYREL) 50 MG tablet, Take 0.5-1 tablets (25-50 mg total) by mouth at bedtime as needed for sleep., Disp: 30 tablet, Rfl: 1   triamcinolone cream (KENALOG) 0.1 %, Apply 1 application topically 2 (two) times daily., Disp: 45 g, Rfl: 3   Allergies  Allergen Reactions   Penicillins Rash   Latex      Review of Systems  Constitutional: Negative.   Respiratory: Negative.    Cardiovascular: Negative.   Neurological: Negative.   Psychiatric/Behavioral: Negative.      Today's Vitals   06/01/21 1447  BP: 102/80  Pulse: 68  Temp: 98.1 F (36.7 C)  Weight: 159 lb 3.2 oz (72.2 kg)  Height: 5\' 6"  (1.676 m)   Body mass index is 25.7 kg/m.  Wt Readings from Last 3 Encounters:  06/01/21 159 lb 3.2 oz (72.2 kg)  02/24/21 165 lb 3.2 oz (74.9 kg)  11/26/20 163 lb (73.9 kg)    Objective:  Physical Exam  Vitals reviewed.  Constitutional:      General: She is not in acute distress.    Appearance: Normal appearance.  Cardiovascular:     Rate and Rhythm: Normal rate and regular rhythm.     Pulses: Normal pulses.     Heart sounds: Normal heart sounds. No murmur heard. Pulmonary:     Effort: Pulmonary effort is normal. No respiratory distress.     Breath sounds: Normal breath sounds. No wheezing.  Neurological:     General: No focal deficit present.     Mental Status: She is alert and oriented to person, place, and time.     Cranial Nerves: No cranial nerve deficit.     Motor: No weakness.  Psychiatric:        Mood and Affect: Mood normal.        Behavior: Behavior normal.        Thought Content: Thought content normal.        Judgment:  Judgment normal.        Assessment And Plan:     1. Anxiety Comments: I will refer to psychiatry for further evaluation, she is noticing this is getting worse - Ambulatory referral to Psychiatry  2. Current mild episode of major depressive disorder without prior episode [facility]) She is willing to try lexapro again more consistently. Advised to not stop abruptly may have an adverse reaction  3. Mood changes Will check for a metabolic cause.  - TSH  4. Encounter for hepatitis C screening test for low risk patient Will check Hepatitis C screening due to recent recommendations to screen all adults 18 years and older - Hepatitis C antibody  5. Encounter to establish care     Patient was given opportunity to ask questions. Patient verbalized understanding of the plan and was able to repeat key elements of the plan. All questions were answered to their satisfaction.  Arnette Felts, FNP   I, Arnette Felts, FNP, have reviewed all documentation for this visit. The documentation on 06/01/21 for the exam, diagnosis, procedures, and orders are all accurate and complete.   IF YOU HAVE BEEN REFERRED TO A SPECIALIST, IT MAY TAKE 1-2 WEEKS TO SCHEDULE/PROCESS THE REFERRAL. IF YOU HAVE NOT HEARD FROM US/SPECIALIST IN TWO WEEKS, PLEASE GIVE [place] A CALL AT [phone].   THE PATIENT IS ENCOURAGED TO PRACTICE SOCIAL DISTANCING DUE TO THE COVID-19 PANDEMIC.

## 2021-06-02 LAB — HEPATITIS C ANTIBODY: Hep C Virus Ab: 0.2 s/co ratio (ref 0.0–0.9)

## 2021-06-02 LAB — TSH: TSH: 1.41 u[IU]/mL (ref 0.450–4.500)

## 2021-06-07 ENCOUNTER — Encounter: Payer: Self-pay | Admitting: Nurse Practitioner

## 2021-06-09 ENCOUNTER — Encounter: Payer: Self-pay | Admitting: Nurse Practitioner

## 2021-07-22 ENCOUNTER — Telehealth (HOSPITAL_BASED_OUTPATIENT_CLINIC_OR_DEPARTMENT_OTHER): Admitting: Psychiatry

## 2021-07-22 ENCOUNTER — Telehealth (HOSPITAL_COMMUNITY): Payer: Self-pay | Admitting: Psychiatry

## 2021-07-22 ENCOUNTER — Encounter (HOSPITAL_COMMUNITY): Payer: Self-pay | Admitting: Psychiatry

## 2021-07-22 ENCOUNTER — Other Ambulatory Visit: Payer: Self-pay

## 2021-07-22 VITALS — Wt 158.0 lb

## 2021-07-22 DIAGNOSIS — F3160 Bipolar disorder, current episode mixed, unspecified: Secondary | ICD-10-CM | POA: Diagnosis not present

## 2021-07-22 DIAGNOSIS — F419 Anxiety disorder, unspecified: Secondary | ICD-10-CM

## 2021-07-22 MED ORDER — ARIPIPRAZOLE 2 MG PO TABS: 2.0000 mg | ORAL_TABLET | Freq: Every day | ORAL | 0 refills | Status: DC
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
## 2021-08-03 ENCOUNTER — Other Ambulatory Visit: Payer: Self-pay | Admitting: Nurse Practitioner

## 2021-08-03 DIAGNOSIS — F411 Generalized anxiety disorder: Secondary | ICD-10-CM

## 2021-08-12 ENCOUNTER — Telehealth (HOSPITAL_BASED_OUTPATIENT_CLINIC_OR_DEPARTMENT_OTHER): Admitting: Psychiatry

## 2021-08-12 ENCOUNTER — Encounter (HOSPITAL_COMMUNITY): Payer: Self-pay | Admitting: Psychiatry

## 2021-08-12 VITALS — Wt 154.0 lb

## 2021-08-12 DIAGNOSIS — F419 Anxiety disorder, unspecified: Secondary | ICD-10-CM | POA: Diagnosis not present

## 2021-08-12 DIAGNOSIS — F3160 Bipolar disorder, current episode mixed, unspecified: Secondary | ICD-10-CM

## 2021-08-12 MED ORDER — ARIPIPRAZOLE 2 MG PO TABS: 2.0000 mg | ORAL_TABLET | Freq: Every day | ORAL | 0 refills | Status: DC

## 2021-08-12 MED ORDER — TRAZODONE HCL 100 MG PO TABS: 100.0000 mg | ORAL_TABLET | Freq: Every day | ORAL | 0 refills | Status: DC

## 2021-08-12 MED ORDER — ESCITALOPRAM OXALATE 20 MG PO TABS
20.0000 mg | ORAL_TABLET | Freq: Every day | ORAL | 0 refills | Status: DC
Start: 2021-08-12 — End: 2021-11-15

## 2021-08-12 NOTE — Progress Notes (Signed)
Virtual Visit via Video Note ? ?I connected with Yvonne Christensen on 08/12/21 at 10:00 AM EDT by a video enabled telemedicine application and verified that I am speaking with the correct person using two identifiers. ? ?Location: ?Patient: In Car ?Provider: Home Office ?  ?I discussed the limitations of evaluation and management by telemedicine and the availability of in person appointments. The patient expressed understanding and agreed to proceed. ? ?History of Present Illness: ?Patient is evaluated by video session.  She is a 26 year old African-American single employed female who is referred by herself for seeking help to address her mental symptoms.  She has history of anger, mood swings, impulsive behavior, agitation and anxiety.  She was taking Lexapro prescribed by PCP which helps her anxiety and depression but patient also had paranoia, trust issues and occasionally reporting hearing voices.  We started her on low-dose Abilify.  She is taking 2 mg every day and she noticed improvement in her mood.  She is more calm and has not involved in any anger.  We also increased trazodone and she is sleeping better.  She reported lately she has to talk to [employer] and usually she gets very irritable and agitated but conversation was very calm.  She also started going to gym regularly and trying to keep her active.  She is separated but had to talk to her daughter's father due to issues at the school with the daughter.  She is tolerating Abilify and reported no tremors, shakes or any EPS.  She is watching her calorie intake.  She is making meal prep for every day and that helps to focus on her health.  She has lupus and eczema.  ?  ?Past Psychiatric History: ?H/O mood swings, anger, depression, anxiety and impulsive decisions. Had significant guilt after an abortion few years ago. Multiple speeding tickets. No h/o inpatient treatment or suicidal attempt.  PCP started Lexapro.  ? ? ?Psychiatric Specialty  Exam: ?Physical Exam  ?Review of Systems  ?Weight 154 lb (69.9 kg).There is no height or weight on file to calculate BMI.  ?General Appearance: Casual  ?Eye Contact:  Good  ?Speech:  Normal Rate  ?Volume:  Normal  ?Mood:  Euthymic  ?Affect:  Congruent  ?Thought Process:  Goal Directed  ?Orientation:  Full (Time, Place, and Person)  ?Thought Content:  Logical  ?Suicidal Thoughts:  No  ?Homicidal Thoughts:  No  ?Memory:  Immediate;   Good ?Recent;   Good ?Remote;   Good  ?Judgement:  Intact  ?Insight:  Present  ?Psychomotor Activity:  Normal  ?Concentration:  Concentration: Good and Attention Span: Good  ?Recall:  Good  ?Fund of Knowledge:  Good  ?Language:  Good  ?Akathisia:  No  ?Handed:  Right  ?AIMS (if indicated):     ?Assets:  Communication Skills ?Desire for Improvement ?Housing ?Resilience ?Social Support ?Talents/Skills ?Transportation  ?ADL's:  Intact  ?Cognition:  WNL  ?Sleep:   better  ? ? ? ? ?Assessment and Plan: ?Bipolar disorder, mixed type.  Anxiety. ? ?Patient tolerating Abilify 2 mg very well.  She has no concern.  Her sleep is improved and her anxiety is much better.  Patient is taking Abilify for past 2 weeks.  We discussed to keep the current dose for now however we will consider adjusting the dose on her next appointment.  She is in therapy with [name].  When she gets anxious she does breathing technique that helps.  She like to continue all her  psychiatric medication from this office.  We will send Abilify 2 mg daily, Lexapro 20 mg daily and trazodone 100 mg at bedtime.  Discussed medication side effects and benefits.  Recommended to call us back if she has any question or any concern.  Follow-up in 4 weeks. ? ? ? ?Collaboration of Care: Primary Care Provider AEB notes available in Epic to review.  ? ?Patient/Guardian was advised Release of Information must be obtained prior to any record release in order to collaborate their care with an outside provider. Patient/Guardian was advised  if they have not already done so to contact the registration department to sign all necessary forms in order for [place] to release information regarding their care.  ? ?Consent: Patient/Guardian gives verbal consent for treatment and assignment of benefits for services provided during this visit. Patient/Guardian expressed understanding and agreed to proceed.   ? ?Follow Up Instructions: ? ?  ?I discussed the assessment and treatment plan with the patient. The patient was provided an opportunity to ask questions and all were answered. The patient agreed with the plan and demonstrated an understanding of the instructions. ?  ?The patient was advised to call back or seek an in-person evaluation if the symptoms worsen or if the condition fails to improve as anticipated. ? ?I provided 32 minutes of non-face-to-face time during this encounter. ? ? ?Cleotis Nipper, MD  ?

## 2021-08-19 ENCOUNTER — Other Ambulatory Visit (HOSPITAL_COMMUNITY)
Admission: RE | Admit: 2021-08-19 | Discharge: 2021-08-19 | Disposition: A | Discharge status: Home / Self Care | Source: Ambulatory Visit | Attending: Nurse Practitioner | Admitting: Nurse Practitioner

## 2021-08-19 ENCOUNTER — Ambulatory Visit (INDEPENDENT_AMBULATORY_CARE_PROVIDER_SITE_OTHER): Admitting: Nurse Practitioner

## 2021-08-19 ENCOUNTER — Encounter: Payer: Self-pay | Admitting: Nurse Practitioner

## 2021-08-19 VITALS — BP 100/60 | HR 88 | Temp 98.3°F | Ht 66.0 in | Wt 155.0 lb

## 2021-08-19 DIAGNOSIS — Z3040 Encounter for surveillance of contraceptives, unspecified: Secondary | ICD-10-CM

## 2021-08-19 DIAGNOSIS — Z134 Encounter for screening for unspecified developmental delays: Secondary | ICD-10-CM | POA: Insufficient documentation

## 2021-08-19 DIAGNOSIS — R202 Paresthesia of skin: Secondary | ICD-10-CM

## 2021-08-19 DIAGNOSIS — R0789 Other chest pain: Secondary | ICD-10-CM | POA: Diagnosis not present

## 2021-08-19 DIAGNOSIS — Z113 Encounter for screening for infections with a predominantly sexual mode of transmission: Secondary | ICD-10-CM | POA: Diagnosis not present

## 2021-08-19 LAB — POCT URINE PREGNANCY: Preg Test, Ur: NEGATIVE

## 2021-08-19 NOTE — Patient Instructions (Signed)

## 2021-08-19 NOTE — Progress Notes (Signed)
?Clinical biochemist as a Neurosurgeon for [employer], FNP.,have documented all relevant documentation on the behalf of Arnette Felts, FNP,as directed by  Arnette Felts, FNP while in the presence of Arnette Felts, FNP. ? ?This visit occurred during the SARS-CoV-2 public health emergency.  Safety protocols were in place, including screening questions prior to the visit, additional usage of staff PPE, and extensive cleaning of exam room while observing appropriate contact time as indicated for disinfecting solutions. ? ?Subjective:  ?  ? Patient ID: Yvonne Christensen , female    DOB: 09-18-1995 , 26 y.o.   MRN: [id number] ? ? ?Chief Complaint  ?Patient presents with  ? Contraception  ? ? ?HPI ? ?Patient present for contraception, she has been on depo and would like to restart took since high school for 6 years. She had been on oral contraceptives prior to this and had bad migraines. LMP - 08/09/2021. She is checked or HIV yearly with the [employer]. ? ?She has had sharp chest pains intermittent last for hours. This pain has been ongoing for years. Has not seen a cardiologist for treatment. No medications have been taking. She is noticing regardless if having stress or not. Occurs once a week and has cut back on caffeine with water and cranberry juice.  ?  ? ?Past Medical History:  ?Diagnosis Date  ? Bacterial vaginosis 03/2016  ? being treated, dosnot remember med name  ? Decreased fetal movement in pregnancy, antepartum 04/23/2016  ? Leakage of amniotic fluid 03/25/2016  ? Lupus (HCC)   ? Sjogren's syndrome (HCC)   ?  ? ?Family History  ?Problem Relation Age of Onset  ? Breast cancer Mother   ? Bipolar disorder Mother   ? Cirrhosis Father   ? ADD / ADHD Sister   ? Hypertension Maternal Grandmother   ? Prostate cancer Maternal Grandfather   ? Bipolar disorder Paternal Grandmother   ? ? ? ?Current Outpatient Medications:  ?  ARIPiprazole (ABILIFY) 2 MG tablet, Take 1 tablet (2 mg total) by mouth daily., Disp: 30 tablet, Rfl: 0 ?   azaTHIOprine (IMURAN) 50 MG tablet, Take 100 mg by mouth daily as needed., Disp: , Rfl:  ?  escitalopram (LEXAPRO) 20 MG tablet, Take 1 tablet (20 mg total) by mouth daily., Disp: 90 tablet, Rfl: 0 ?  hydroxychloroquine (PLAQUENIL) 200 MG tablet, Take 200 mg by mouth daily., Disp: , Rfl:  ?  traZODone (DESYREL) 100 MG tablet, Take 1 tablet (100 mg total) by mouth at bedtime., Disp: 30 tablet, Rfl: 0 ?  triamcinolone cream (KENALOG) 0.1 %, Apply 1 application topically 2 (two) times daily., Disp: 45 g, Rfl: 3 ?  Vitamin D, Ergocalciferol, (DRISDOL) 1.25 MG (50000 UNIT) CAPS capsule, Take 50,000 Units by mouth once a week., Disp: , Rfl:   ? ?Allergies  ?Allergen Reactions  ? Penicillins Rash  ? Latex   ?  ? ?Review of Systems  ?Constitutional: Negative.   ?Respiratory: Negative.    ?Cardiovascular: Negative.   ?Gastrointestinal: Negative.   ?Neurological: Negative.   ?     Tingling feet for more than 6 months  ?Psychiatric/Behavioral: Negative.     ? ?Today's Vitals  ? 08/19/21 1608  ?BP: 100/60  ?Pulse: 88  ?Temp: 98.3 ?F (36.8 ?C)  ?TempSrc: Oral  ?Weight: 155 lb (70.3 kg)  ?Height: 5\' 6"  (1.676 m)  ? ?Body mass index is 25.02 kg/m?.  ? ?Objective:  ?Physical Exam ?Vitals reviewed.  ?Constitutional:   ?   General: She is  not in acute distress. ?   Appearance: Normal appearance.  ?Cardiovascular:  ?   Rate and Rhythm: Normal rate and regular rhythm.  ?   Pulses: Normal pulses.  ?   Heart sounds: Normal heart sounds. No murmur heard. ?Pulmonary:  ?   Effort: Pulmonary effort is normal. No respiratory distress.  ?   Breath sounds: Normal breath sounds. No wheezing.  ?Neurological:  ?   General: No focal deficit present.  ?   Mental Status: She is alert and oriented to person, place, and time.  ?   Cranial Nerves: No cranial nerve deficit.  ?   Motor: No weakness.  ?Psychiatric:     ?   Mood and Affect: Mood normal.     ?   Behavior: Behavior normal.     ?   Thought Content: Thought content normal.     ?   Judgment:  Judgment normal.  ?  ? ?   ?Assessment And Plan:  ?   ?1. Other chest pain ?Comments: EKG done with NSR HR 63, since this has been ongoing for several years. Will refer to Cardiology to be evaluated. ?- EKG 12-Lead ?- Ambulatory referral to Cardiology ? ?2. Tingling of both feet ?Comments: Will check vitamin B12.  ?- Vitamin B12 ? ?3. Encounter for surveillance of contraceptives, unspecified contraceptive ?Comments: She had been on Depo for 6 years, will refer to GYN for paraguard or other options.  ?- POCT Urine Pregnancy ?- Ambulatory referral to Obstetrics / Gynecology ? ?4. Screening for STD (sexually transmitted disease) ?Comments: No current symptoms ?- Urine cytology ancillary only ?  ? ? ?Patient was given opportunity to ask questions. Patient verbalized understanding of the plan and was able to repeat key elements of the plan. All questions were answered to their satisfaction.  ?Arnette Felts, FNP  ? ?I, Arnette Felts, FNP, have reviewed all documentation for this visit. The documentation on 08/19/21 for the exam, diagnosis, procedures, and orders are all accurate and complete.  ? ?IF YOU HAVE BEEN REFERRED TO A SPECIALIST, IT MAY TAKE 1-2 WEEKS TO SCHEDULE/PROCESS THE REFERRAL. IF YOU HAVE NOT HEARD FROM US/SPECIALIST IN TWO WEEKS, PLEASE GIVE [place] A CALL AT [phone].  ? ?THE PATIENT IS ENCOURAGED TO PRACTICE SOCIAL DISTANCING DUE TO THE COVID-19 PANDEMIC.   ?

## 2021-08-20 LAB — VITAMIN B12: Vitamin B-12: 741 pg/mL (ref 232–1245)

## 2021-08-25 LAB — URINE CYTOLOGY ANCILLARY ONLY
Bacterial Vaginitis-Urine: NEGATIVE
Candida Urine: NEGATIVE
Chlamydia: NEGATIVE
Comment: NEGATIVE
Comment: NEGATIVE
Comment: NORMAL
Neisseria Gonorrhea: NEGATIVE
Trichomonas: NEGATIVE

## 2021-09-06 ENCOUNTER — Ambulatory Visit: Admitting: Nurse Practitioner

## 2021-09-13 ENCOUNTER — Encounter (HOSPITAL_COMMUNITY): Payer: Self-pay | Admitting: Psychiatry

## 2021-09-13 ENCOUNTER — Telehealth (HOSPITAL_BASED_OUTPATIENT_CLINIC_OR_DEPARTMENT_OTHER): Admitting: Psychiatry

## 2021-09-13 DIAGNOSIS — F3160 Bipolar disorder, current episode mixed, unspecified: Secondary | ICD-10-CM | POA: Diagnosis not present

## 2021-09-13 DIAGNOSIS — F419 Anxiety disorder, unspecified: Secondary | ICD-10-CM

## 2021-09-13 MED ORDER — TRAZODONE HCL 100 MG PO TABS: 100.0000 mg | ORAL_TABLET | Freq: Every day | ORAL | 1 refills | Status: DC

## 2021-09-13 MED ORDER — ARIPIPRAZOLE 5 MG PO TABS: 5.0000 mg | ORAL_TABLET | Freq: Every day | ORAL | 1 refills | Status: DC

## 2021-09-13 NOTE — Progress Notes (Signed)
Virtual Visit via Telephone Note ? ?I connected with Lenoard Aden on 09/13/21 at 10:20 AM EDT by telephone and verified that I am speaking with the correct person using two identifiers. ? ?Location: ?Patient: In car. ?Provider: Home office ?  ?I discussed the limitations, risks, security and privacy concerns of performing an evaluation and management service by telephone and the availability of in person appointments. I also discussed with the patient that there may be a patient responsible charge related to this service. The patient expressed understanding and agreed to proceed. ? ? ?History of Present Illness: ?Patient is evaluated by phone session.  She reported to set up video session but able to talk on the phone.  She still have some mood lability, impulsive behavior and irritability but manageable.  Her sleep is improved.  She is taking trazodone Lexapro and Abilify 2 mg.  So far she is tolerating her medication and reported no side effects, tremor or shakes or any EPS.  Her appetite is fair and she denies any weight gain or weight loss.  Today she is going to see her lupus doctor for checkup.  Sometimes she does have a flareup of lupus.  Patient denies any mania, psychosis, suicidal thoughts.  She is still working on her paranoia and trust issue but no more hallucinations.  Patient currently separated but does not touch with her daughter's father if there is a issue that need to be discussed.  Patient denies drinking or using any illegal substances.  She denies any panic attack. ? ?Past Psychiatric History: ?H/O mood swings, anger, depression, anxiety and impulsive decisions. Had significant guilt after an abortion few years ago. Multiple speeding tickets. No h/o inpatient treatment or suicidal attempt.  PCP started Lexapro.  ? ?Psychiatric Specialty Exam: ?Physical Exam  ?Review of Systems  ?Weight 155 lb (70.3 kg).There is no height or weight on file to calculate BMI.  ?General Appearance: NA  ?Eye  Contact:  NA  ?Speech:  Clear and Coherent  ?Volume:  Normal  ?Mood:   Sometimes irritable.  ?Affect:  NA  ?Thought Process:  Goal Directed  ?Orientation:  Full (Time, Place, and Person)  ?Thought Content:  Rumination and trust issues. No delusion, hallucination.  ?Suicidal Thoughts:  No  ?Homicidal Thoughts:  No  ?Memory:  Immediate;   Good ?Recent;   Good ?Remote;   Good  ?Judgement:  Intact  ?Insight:  Present  ?Psychomotor Activity:  NA  ?Concentration:  Concentration: Good and Attention Span: Good  ?Recall:  Good  ?Fund of Knowledge:  Good  ?Language:  Good  ?Akathisia:  No  ?Handed:  Right  ?AIMS (if indicated):     ?Assets:  Communication Skills ?Desire for Improvement ?Housing ?Resilience ?Transportation  ?ADL's:  Intact  ?Cognition:  WNL  ?Sleep:   better  ? ? ? ? ?Assessment and Plan: ?Bipolar disorder, mixed type.  Anxiety. ? ?Recommend to try increase Abilify 5 mg to help her residual symptoms.  Patient agreed to give a try.  Encouraged to continue therapy with [name].  Continue Lexapro 20 mg daily, trazodone 100 mg daily and we will increase Abilify 5 mg daily.  Patient has refills on Lexapro.  We will provide a new prescription of trazodone and Abilify.  Follow-up in 2 months. ? ?Follow Up Instructions: ? ?  ?I discussed the assessment and treatment plan with the patient. The patient was provided an opportunity to ask questions and all were answered. The patient agreed with the plan and  demonstrated an understanding of the instructions. ?  ?The patient was advised to call back or seek an in-person evaluation if the symptoms worsen or if the condition fails to improve as anticipated. ? ?Collaboration of Care: Other provider involved in patient's care AEB notes are available in epic to review. ? ?Patient/Guardian was advised Release of Information must be obtained prior to any record release in order to collaborate their care with an outside provider. Patient/Guardian was advised if they have  not already done so to contact the registration department to sign all necessary forms in order for [place] to release information regarding their care.  ? ?Consent: Patient/Guardian gives verbal consent for treatment and assignment of benefits for services provided during this visit. Patient/Guardian expressed understanding and agreed to proceed.   ? ?I provided 15 minutes of non-face-to-face time during this encounter. ? ? ?Cleotis Nipper, MD  ?

## 2021-10-14 ENCOUNTER — Ambulatory Visit: Admitting: Nurse Practitioner

## 2021-10-20 ENCOUNTER — Ambulatory Visit: Admitting: Nurse Practitioner

## 2021-11-03 IMAGING — MR MR HIP*R* W/CM
4 of 6 series · 19 of 40 positions shown · IV contrast (gadavist)
Comparison: None.

CLINICAL DATA: Right hip and radiating right leg pain when running.

EXAM:
MRI OF THE RIGHT HIP WITH CONTRAST
TECHNIQUE: Multiplanar, multisequence MR imaging was performed following the
administration of intravenous contrast.
CONTRAST:  0.05mL GADAVIST GADOBUTROL 1 MMOL/ML IV SOLN

[Series 8: T1 · coronal · right · 4.0mm · 0.59mm/px · 7 of 38 slices shown]
[im 1/38]
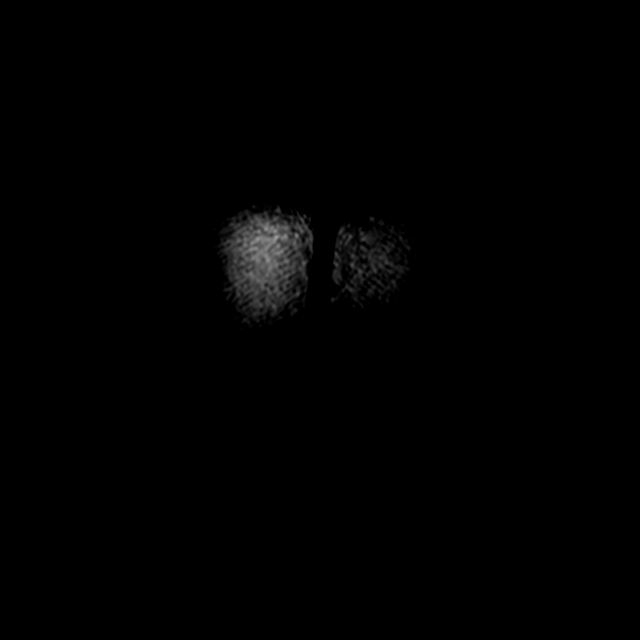
[im 6/38]
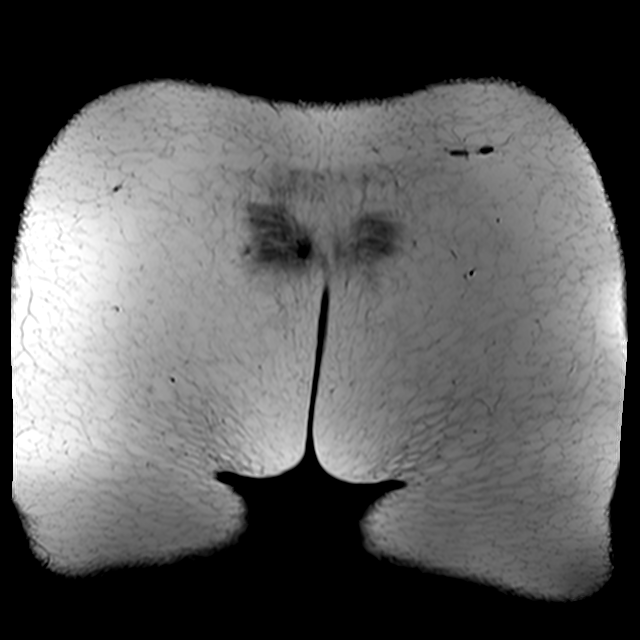
[im 11/38]
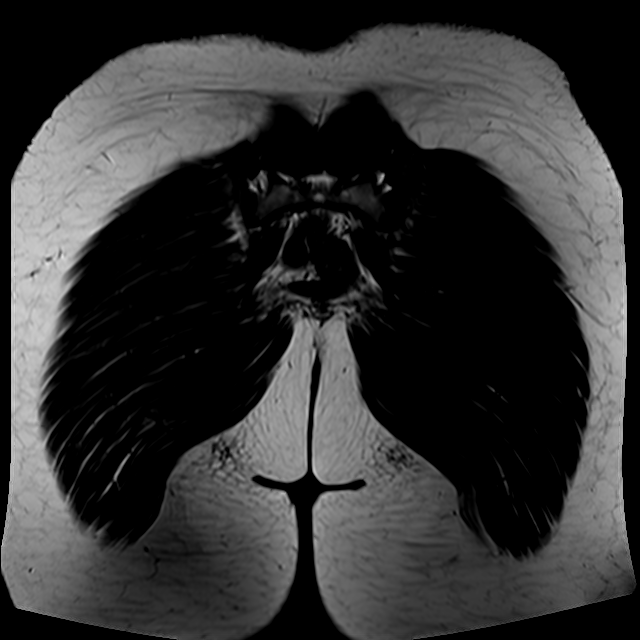
[im 16/38]
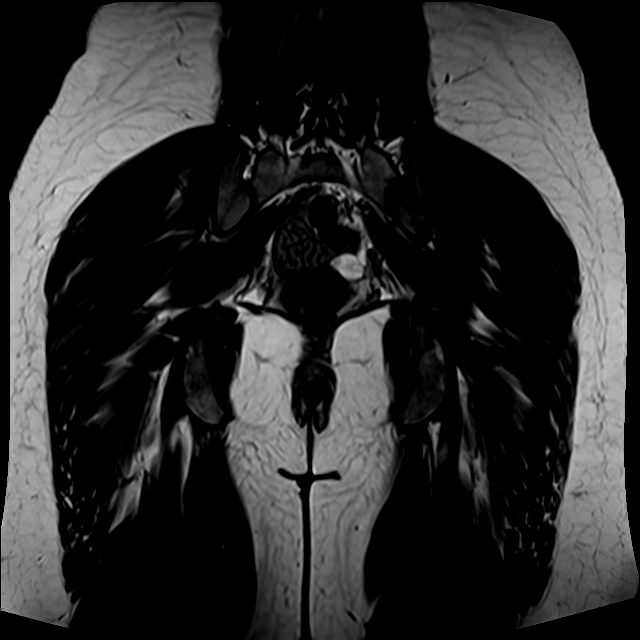
[im 22/38]
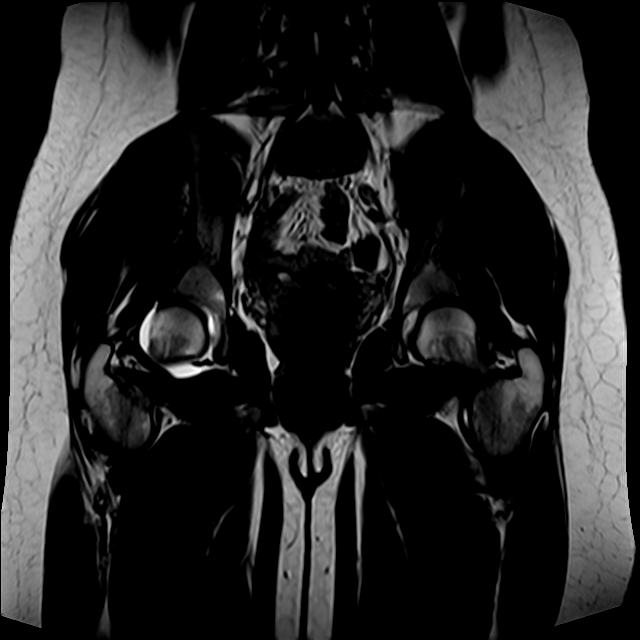
[im 27/38]
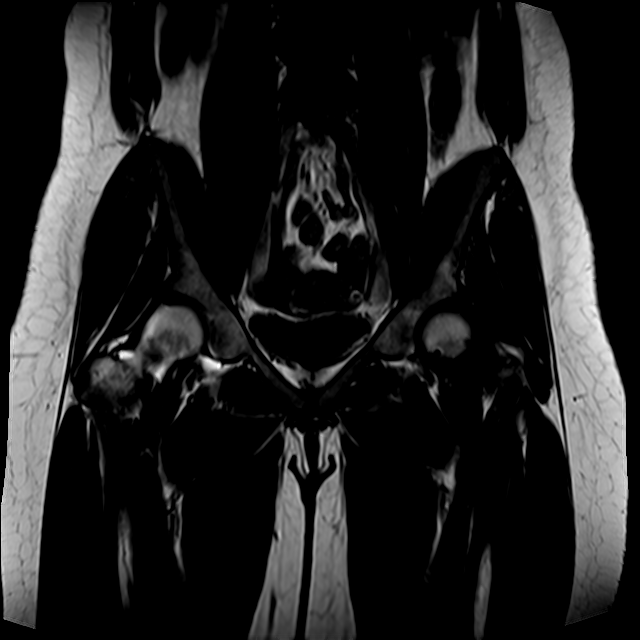
[im 32/38]
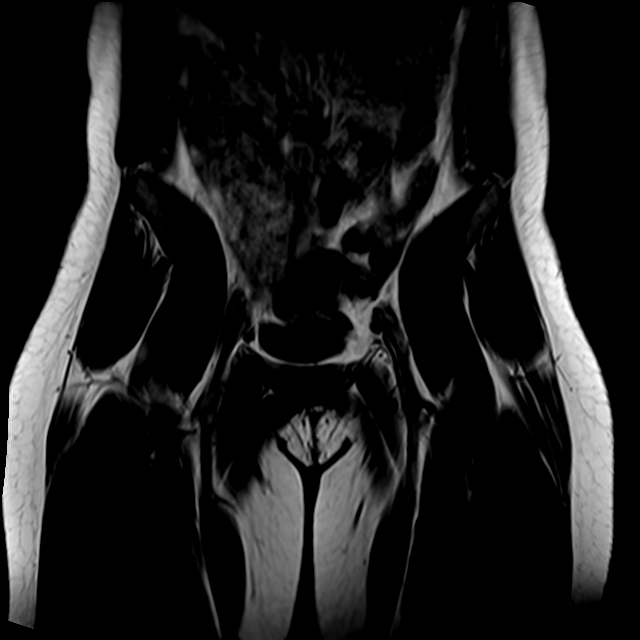

[Series 10: T2 fat-sat · axial · right · 4.0mm · 0.70mm/px · z∈[-143,+12]mm · 6 of 32 slices shown]
[im 1/32]
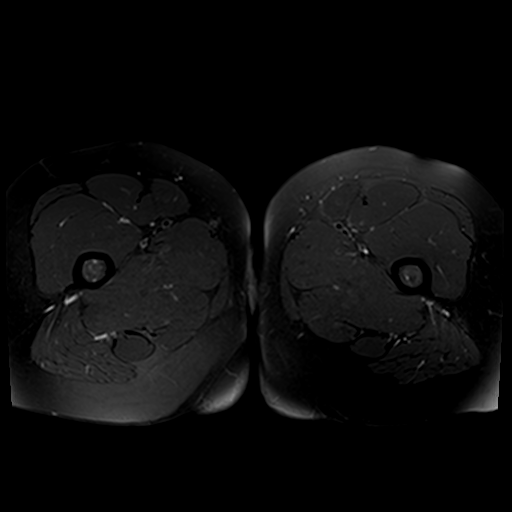
[im 7/32]
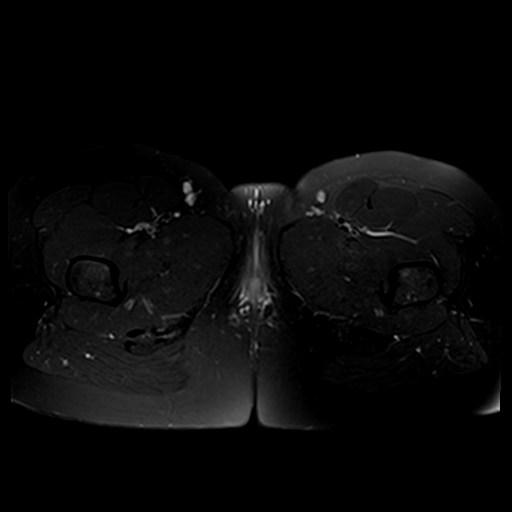
[im 13/32]
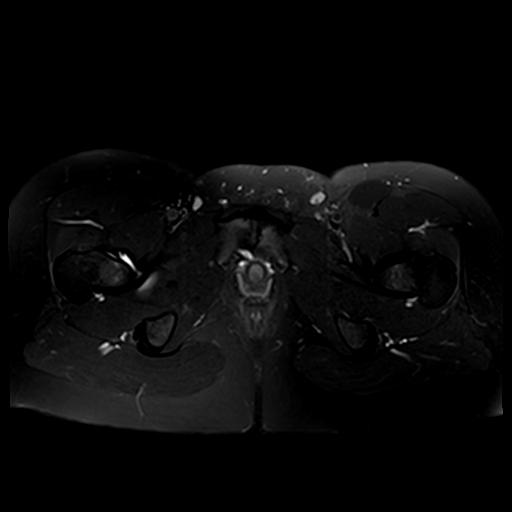
[im 19/32]
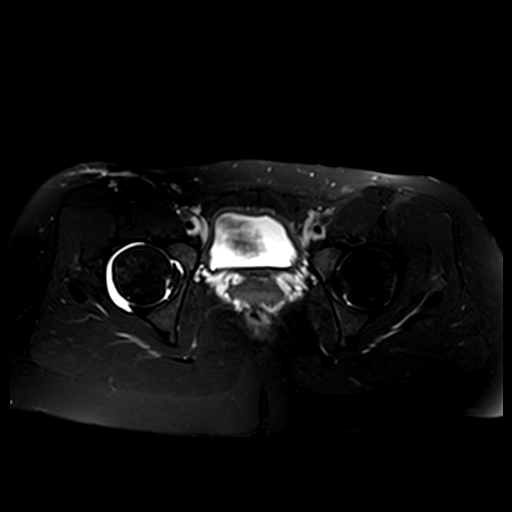
[im 25/32]
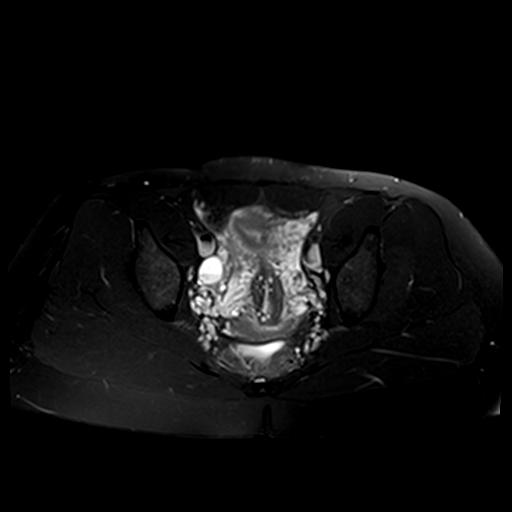
[im 32/32]
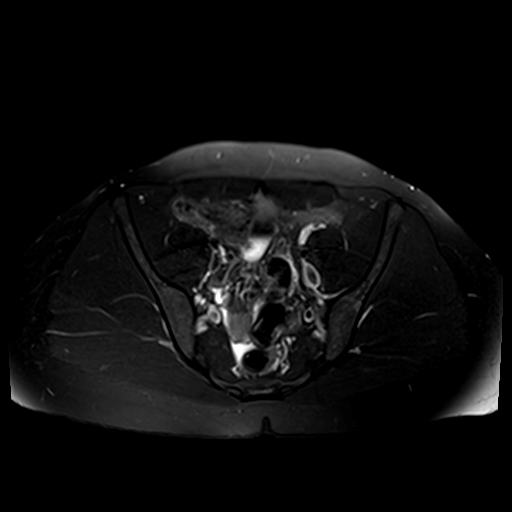

[Series 11: T1 fat-sat · axial · right · 4.0mm · 0.70mm/px · z∈[-136,-35]mm · 3 of 30 slices shown (1 of 2)]
[im 6/30]
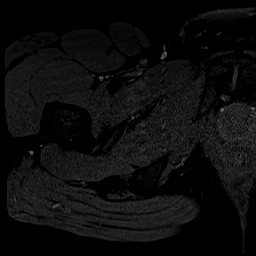
[im 18/30]
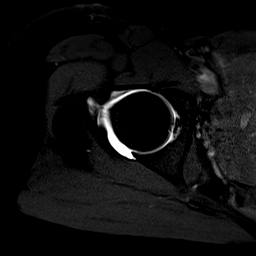
[im 30/30]
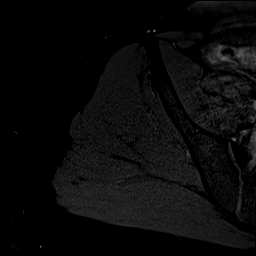

[Series 12: T1 fat-sat · sagittal · right · 4.0mm · 0.62mm/px · 3 of 30 slices shown (2 of 2)]
[im 6/30]
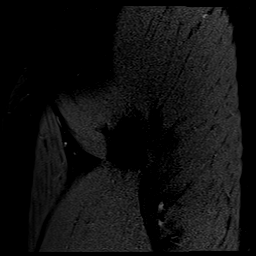
[im 18/30]
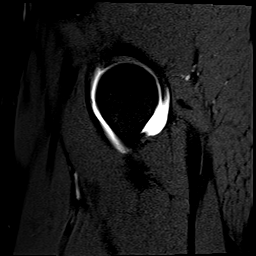
[im 30/30]
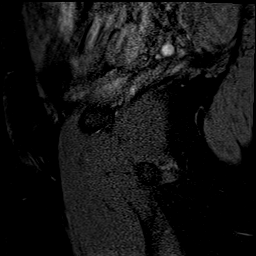

[19 of 40 positions shown; findings below may reference images not displayed]

FINDINGS: Bones: Both hips are normally located. No stress fracture or AVN.
The articular cartilage is intact. No chondral defects. The labrum
is unremarkable. No labral tears are identified.

No findings for peritendinosis or trochanteric bursitis. The
iliopsoas tendon and tensor fascia lata are intact. Both hamstring
tendons appear normal.

The surrounding hip and pelvic musculature are unremarkable. No
muscle tear, myositis or mass. Symmetric appearance of the
piriformis muscles without evidence of inflammation/edema.

Mild degenerative changes at the pubic symphysis but no fracture or
bone lesion. The SI joints are unremarkable. No pelvic stress
fractures or bone lesions.

No signal intrapelvic abnormalities are identified. There is a small
amount of free pelvic fluid which is likely physiologic. Small
nabothian cysts are noted along the cervix.
IMPRESSION: 1. No significant bony findings. No degenerative changes or stress
fracture or AVN.
2. Unremarkable appearance of the surrounding hip and pelvic
musculature.
3. No cartilage defects or labral tear.
4. Mild degenerative changes at the pubic symphysis but no fracture
or bone lesion.

## 2021-11-04 ENCOUNTER — Encounter: Payer: Self-pay | Admitting: Nurse Practitioner

## 2021-11-04 ENCOUNTER — Other Ambulatory Visit (HOSPITAL_COMMUNITY)
Admission: RE | Admit: 2021-11-04 | Discharge: 2021-11-04 | Disposition: A | Discharge status: Home / Self Care | Source: Ambulatory Visit | Attending: Nurse Practitioner | Admitting: Nurse Practitioner

## 2021-11-04 ENCOUNTER — Ambulatory Visit (INDEPENDENT_AMBULATORY_CARE_PROVIDER_SITE_OTHER): Admitting: Nurse Practitioner

## 2021-11-04 VITALS — BP 120/70 | HR 60 | Temp 98.2°F | Ht 66.0 in | Wt 162.0 lb

## 2021-11-04 DIAGNOSIS — F419 Anxiety disorder, unspecified: Secondary | ICD-10-CM | POA: Diagnosis not present

## 2021-11-04 DIAGNOSIS — M3501 Sicca syndrome with keratoconjunctivitis: Secondary | ICD-10-CM

## 2021-11-04 DIAGNOSIS — F3160 Bipolar disorder, current episode mixed, unspecified: Secondary | ICD-10-CM

## 2021-11-04 DIAGNOSIS — Z113 Encounter for screening for infections with a predominantly sexual mode of transmission: Secondary | ICD-10-CM | POA: Insufficient documentation

## 2021-11-04 DIAGNOSIS — Z0000 Encounter for general adult medical examination without abnormal findings: Secondary | ICD-10-CM | POA: Diagnosis not present

## 2021-11-04 NOTE — Patient Instructions (Signed)

## 2021-11-04 NOTE — Progress Notes (Signed)
This visit occurred during the SARS-CoV-2 public health emergency.  Safety protocols were in place, including screening questions prior to the visit, additional usage of staff PPE, and extensive cleaning of exam room while observing appropriate contact time as indicated for disinfecting solutions.  Subjective:     Patient ID: Yvonne Christensen , female    DOB: November 23, 1995 , 26 y.o.   MRN: [id number]   Chief Complaint  Patient presents with   Annual Exam    HPI  Patient presents today for a physical.      Past Medical History:  Diagnosis Date   Bacterial vaginosis 03/2016   being treated, dosnot remember med name   Decreased fetal movement in pregnancy, antepartum 04/23/2016   Leakage of amniotic fluid 03/25/2016   Lupus ([place])    Sjogren's syndrome ([place])      Family History  Problem Relation Age of Onset   Breast cancer Mother    Bipolar disorder Mother    Cirrhosis Father    ADD / ADHD Sister    Hypertension Maternal Grandmother    Prostate cancer Maternal Grandfather    Bipolar disorder Paternal Grandmother      Current Outpatient Medications:    ARIPiprazole (ABILIFY) 5 MG tablet, Take 1 tablet (5 mg total) by mouth daily., Disp: 90 tablet, Rfl: 0   azaTHIOprine (IMURAN) 50 MG tablet, Take 100 mg by mouth daily as needed., Disp: , Rfl:    escitalopram (LEXAPRO) 20 MG tablet, Take 1 tablet (20 mg total) by mouth daily., Disp: 90 tablet, Rfl: 0   hydroxychloroquine (PLAQUENIL) 200 MG tablet, Take 200 mg by mouth daily., Disp: , Rfl:    traZODone (DESYREL) 100 MG tablet, Take 1 tablet (100 mg total) by mouth at bedtime., Disp: 90 tablet, Rfl: 0   triamcinolone cream (KENALOG) 0.1 %, Apply 1 application topically 2 (two) times daily., Disp: 45 g, Rfl: 3   Vitamin D, Ergocalciferol, (DRISDOL) 1.25 MG (50000 UNIT) CAPS capsule, Take 50,000 Units by mouth once a week., Disp: , Rfl:    Allergies  Allergen Reactions   Penicillins Rash   Latex       The patient states she  uses none.  Patient's last menstrual period was 10/29/2021.. Negative for Dysmenorrhea and Negative for Menorrhagia. She has had her menstrual cycle early. Negative for: breast discharge, breast lump(s), breast pain and breast self exam. Associated symptoms include abnormal vaginal bleeding. Pertinent negatives include abnormal bleeding (hematology), anxiety, decreased libido, depression, difficulty falling sleep, dyspareunia, history of infertility, nocturia, sexual dysfunction, sleep disturbances, urinary incontinence, urinary urgency, vaginal discharge and vaginal itching. Diet regular.The patient states her exercise level is moderate - 5 days a week - 50 minutes  The patient's tobacco use is:  Social History   Tobacco Use  Smoking Status Never  Smokeless Tobacco Never   She has been exposed to passive smoke. The patient's alcohol use is:  Social History   Substance and Sexual Activity  Alcohol Use Not Currently   Additional information: Last pap 2022 with Novo next one scheduled for 3 years.    Review of Systems  Constitutional: Negative.   HENT: Negative.    Eyes: Negative.   Respiratory: Negative.    Cardiovascular: Negative.   Gastrointestinal: Negative.   Endocrine: Negative.   Genitourinary: Negative.   Musculoskeletal: Negative.   Skin: Negative.   Allergic/Immunologic: Negative.   Neurological: Negative.   Hematological: Negative.   Psychiatric/Behavioral: Negative.       Today's Vitals  11/04/21 1034  BP: 120/70  Pulse: 60  Temp: 98.2 F (36.8 C)  TempSrc: Oral  Weight: 162 lb (73.5 kg)  Height: 5' 6"  (1.676 m)   Body mass index is 26.15 kg/m.   Objective:  Physical Exam Constitutional:      General: She is not in acute distress.    Appearance: Normal appearance. She is well-developed.  HENT:     Head: Normocephalic and atraumatic.     Right Ear: Hearing, tympanic membrane, ear canal and external ear normal. There is no impacted cerumen.     Left  Ear: Hearing, tympanic membrane, ear canal and external ear normal. There is no impacted cerumen.     Nose: Nose normal.     Mouth/Throat:     Mouth: Mucous membranes are moist.  Eyes:     General: Lids are normal.     Extraocular Movements: Extraocular movements intact.     Conjunctiva/sclera: Conjunctivae normal.     Pupils: Pupils are equal, round, and reactive to light.     Funduscopic exam:    Right eye: No papilledema.        Left eye: No papilledema.  Neck:     Thyroid: No thyroid mass.     Vascular: No carotid bruit.  Cardiovascular:     Rate and Rhythm: Normal rate and regular rhythm.     Pulses: Normal pulses.     Heart sounds: Normal heart sounds. No murmur heard. Pulmonary:     Effort: Pulmonary effort is normal. No respiratory distress.     Breath sounds: Normal breath sounds. No wheezing.  Chest:     Chest wall: No mass.  Breasts:    Tanner Score is 5.     Right: Normal. No mass or tenderness.     Left: Normal. No mass or tenderness.  Abdominal:     General: Abdomen is flat. Bowel sounds are normal. There is no distension.     Palpations: Abdomen is soft.     Tenderness: There is no abdominal tenderness.  Genitourinary:    Rectum: Guaiac result negative.  Musculoskeletal:        General: No swelling. Normal range of motion.     Cervical back: Full passive range of motion without pain, normal range of motion and neck supple.     Right lower leg: No edema.     Left lower leg: No edema.  Lymphadenopathy:     Upper Body:     Right upper body: No supraclavicular, axillary or pectoral adenopathy.     Left upper body: No supraclavicular, axillary or pectoral adenopathy.  Skin:    General: Skin is warm and dry.     Capillary Refill: Capillary refill takes less than 2 seconds.     Comments: Belly ring  Neurological:     General: No focal deficit present.     Mental Status: She is alert and oriented to person, place, and time.     Cranial Nerves: No cranial nerve  deficit.     Sensory: No sensory deficit.  Psychiatric:        Mood and Affect: Mood normal.        Behavior: Behavior normal.        Thought Content: Thought content normal.        Judgment: Judgment normal.         Assessment And Plan:     1. Routine general medical examination at health care facility Behavior modifications discussed and diet history  reviewed.   Pt will continue to exercise regularly and modify diet with low GI, plant based foods and decrease intake of processed foods.  Recommend intake of daily multivitamin, Vitamin D, and calcium.  Recommend for preventive screenings, as well as recommend immunizations that include influenza, TDAP - CBC - CMP14+EGFR - Lipid panel - Hemoglobin A1c  2. Screening for STD (sexually transmitted disease) - Urine cytology ancillary only - Hepatitis B surface antigen - HSV(herpes simplex vrs) 1+2 ab-IgG - RPR - HIV Antibody (routine testing w rflx)  3. Anxiety Comments: She is doing better, currently under the care of behavioral health  4. 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 Adele Schilder, tolerating her medications well  5. Sjogren syndrome with keratoconjunctivitis ([place]) Comments: Stable     Patient was given opportunity to ask questions. Patient verbalized understanding of the plan and was able to repeat key elements of the plan. All questions were answered to their satisfaction.   Minette Brine, FNP   I, Minette Brine, FNP, have reviewed all documentation for this visit. The documentation on 11/04/21 for the exam, diagnosis, procedures, and orders are all accurate and complete.   THE PATIENT IS ENCOURAGED TO PRACTICE SOCIAL DISTANCING DUE TO THE COVID-19 PANDEMIC.

## 2021-11-05 LAB — LIPID PANEL
Chol/HDL Ratio: 2.2 ratio (ref 0.0–4.4)
Cholesterol, Total: 143 mg/dL (ref 100–199)
HDL: 65 mg/dL (ref >39)
LDL Chol Calc (NIH): 69 mg/dL (ref 0–99)
Triglycerides: 36 mg/dL (ref 0–149)
VLDL Cholesterol Cal: 9 mg/dL (ref 5–40)

## 2021-11-05 LAB — HEMOGLOBIN A1C
Est. average glucose Bld gHb Est-mCnc: 108 mg/dL
Hgb A1c MFr Bld: 5.4 % (ref 4.8–5.6)

## 2021-11-05 LAB — CBC
Hematocrit: 40.3 % (ref 34.0–46.6)
Hemoglobin: 13.7 g/dL (ref 11.1–15.9)
MCH: 27.7 pg (ref 26.6–33.0)
MCHC: 34 g/dL (ref 31.5–35.7)
MCV: 81 fL (ref 79–97)
Platelets: 314 10*3/uL (ref 150–450)
RBC: 4.95 x10E6/uL (ref 3.77–5.28)
RDW: 11.7 % (ref 11.7–15.4)
WBC: 5.1 10*3/uL (ref 3.4–10.8)

## 2021-11-05 LAB — RPR: RPR Ser Ql: NONREACTIVE

## 2021-11-05 LAB — HEPATITIS B SURFACE ANTIGEN: Hepatitis B Surface Ag: NEGATIVE

## 2021-11-05 LAB — CMP14+EGFR
ALT: 11 IU/L (ref 0–32)
AST: 13 IU/L (ref 0–40)
Albumin/Globulin Ratio: 1.7 (ref 1.2–2.2)
Albumin: 4.6 g/dL (ref 3.9–5.0)
Alkaline Phosphatase: 52 IU/L (ref 44–121)
BUN/Creatinine Ratio: 13 (ref 9–23)
BUN: 11 mg/dL (ref 6–20)
Bilirubin Total: 0.5 mg/dL (ref 0.0–1.2)
CO2: 21 mmol/L (ref 20–29)
Calcium: 9.1 mg/dL (ref 8.7–10.2)
Chloride: 106 mmol/L (ref 96–106)
Creatinine, Ser: 0.86 mg/dL (ref 0.57–1.00)
Globulin, Total: 2.7 g/dL (ref 1.5–4.5)
Glucose: 87 mg/dL (ref 70–99)
Potassium: 4.2 mmol/L (ref 3.5–5.2)
Sodium: 140 mmol/L (ref 134–144)
Total Protein: 7.3 g/dL (ref 6.0–8.5)
eGFR: 94 mL/min/{1.73_m2} (ref >59)

## 2021-11-05 LAB — HIV ANTIBODY (ROUTINE TESTING W REFLEX): HIV Screen 4th Generation wRfx: NONREACTIVE

## 2021-11-05 LAB — HSV(HERPES SIMPLEX VRS) I + II AB-IGG
HSV 1 Glycoprotein G Ab, IgG: 55.4 index — ABNORMAL HIGH (ref 0.00–0.90)
HSV 2 IgG, Type Spec: <0.91 index (ref 0.00–0.90)

## 2021-11-08 LAB — URINE CYTOLOGY ANCILLARY ONLY
Bacterial Vaginitis-Urine: NEGATIVE
Candida Urine: NEGATIVE
Chlamydia: NEGATIVE
Comment: NEGATIVE
Comment: NEGATIVE
Comment: NORMAL
Neisseria Gonorrhea: NEGATIVE
Trichomonas: NEGATIVE

## 2021-11-15 ENCOUNTER — Encounter (HOSPITAL_COMMUNITY): Payer: Self-pay | Admitting: Psychiatry

## 2021-11-15 ENCOUNTER — Telehealth (HOSPITAL_BASED_OUTPATIENT_CLINIC_OR_DEPARTMENT_OTHER): Admitting: Psychiatry

## 2021-11-15 DIAGNOSIS — F419 Anxiety disorder, unspecified: Secondary | ICD-10-CM | POA: Diagnosis not present

## 2021-11-15 DIAGNOSIS — F3160 Bipolar disorder, current episode mixed, unspecified: Secondary | ICD-10-CM

## 2021-11-15 MED ORDER — ESCITALOPRAM OXALATE 20 MG PO TABS: 20.0000 mg | ORAL_TABLET | Freq: Every day | ORAL | 0 refills | Status: DC

## 2021-11-15 MED ORDER — TRAZODONE HCL 100 MG PO TABS: 100.0000 mg | ORAL_TABLET | Freq: Every day | ORAL | 0 refills | Status: DC

## 2021-11-15 MED ORDER — ARIPIPRAZOLE 5 MG PO TABS: 5.0000 mg | ORAL_TABLET | Freq: Every day | ORAL | 0 refills | Status: DC

## 2021-11-15 NOTE — Progress Notes (Addendum)
Virtual Visit via Video Note  I connected with Yvonne Christensen on 11/15/21 at 11:20 AM EDT by a video enabled telemedicine application and verified that I am speaking with the correct person using two identifiers.  Location: Patient: Home Provider: Office   I discussed the limitations of evaluation and management by telemedicine and the availability of in person appointments. The patient expressed understanding and agreed to proceed.  History of Present Illness: Patient is evaluated by video session.  She retired on May 30 from her [employer] job and now trying transitioning to a civil work.  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  Patient admitted last week she smoked marijuana because she was under a lot of stress.  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  She is in therapy with [name].  She denies any irritability, anger, mania, psychosis but endorsed some anxiety building up as finding a new job.  Last week she did hiking and enjoyed. She is in touch with her daughter's father and told there is agreement that if she unable to find a job that he will help the daughter.  Patient denies any suicidal thoughts or impulsive behavior.  She feels the current medicine working.  She has no tremor or shakes or any EPS.  Her appetite is okay.  Her weight is stable.  Past Psychiatric History: H/O mood swings, anger, depression, anxiety and impulsive decisions. Had significant guilt after an abortion. Multiple speeding tickets. No h/o inpatient treatment or suicidal attempt.  PCP started Lexapro.   Psychiatric Specialty Exam: Physical Exam  Review of Systems  Weight 162 lb (73.5 kg), last menstrual period 10/29/2021.There is no height or weight on file to calculate BMI.  General Appearance: Casual  Eye Contact:  Good  Speech:  Clear and Coherent and Normal Rate  Volume:  Normal   Mood:  Anxious  Affect:  Appropriate  Thought Process:  Goal Directed  Orientation:  Full (Time, Place, and Person)  Thought Content:  Logical  Suicidal Thoughts:  No  Homicidal Thoughts:  No  Memory:  Immediate;   Good Recent;   Good Remote;   Good  Judgement:  Good  Insight:  Present  Psychomotor Activity:  Normal  Concentration:  Concentration: Good and Attention Span: Good  Recall:  Good  Fund of Knowledge:  Good  Language:  Good  Akathisia:  No  Handed:  Right  AIMS (if indicated):     Assets:  Communication Skills Desire for Improvement Housing Social Support Talents/Skills Transportation  ADL's:  Intact  Cognition:  WNL  Sleep:   ok      Assessment and Plan: Bipolar disorder, mixed type.  Anxiety.  We have increased Abilify on the last visit that helps her mood.  We talk about stopping the marijuana as it causes interaction with psychotropic medication and especially she is applying for a new job. Encouraged to continue therapy with [name].  Continue Lexapro 20 mg daily, trazodone 100 mg at bedtime and Abilify 5 mg daily.  Discussed medication side effects and benefits.  Patient may need a letter from her future employer about her current medication.  I recommend to contact front desk office if any forms need to be filled.  Follow-up in 2 months.  Follow Up Instructions:    I discussed the assessment and treatment plan with the patient. The patient was  provided an opportunity to ask questions and all were answered. The patient agreed with the plan and demonstrated an understanding of the instructions.   The patient was advised to call back or seek an in-person evaluation if the symptoms worsen or if the condition fails to improve as anticipated.  I provided 15 minutes of non-face-to-face time during this encounter.   Cleotis Nipper, MD

## 2021-11-23 ENCOUNTER — Encounter

## 2021-11-23 ENCOUNTER — Encounter: Admitting: Physician Assistant

## 2021-11-25 ENCOUNTER — Telehealth: Payer: Self-pay

## 2021-11-25 NOTE — Telephone Encounter (Signed)
Completed medical records request and mailed requesting records to the Department of [facility] at [employer] [address], [place] [address].

## 2021-11-30 ENCOUNTER — Encounter: Admitting: Nurse Practitioner

## 2021-12-13 ENCOUNTER — Ambulatory Visit: Admitting: Nurse Practitioner

## 2021-12-17 ENCOUNTER — Ambulatory Visit: Admitting: Nurse Practitioner

## 2022-01-10 ENCOUNTER — Encounter: Payer: Self-pay | Admitting: Nurse Practitioner

## 2022-01-10 ENCOUNTER — Other Ambulatory Visit (HOSPITAL_COMMUNITY)
Admission: RE | Admit: 2022-01-10 | Discharge: 2022-01-10 | Disposition: A | Discharge status: Home / Self Care | Source: Ambulatory Visit | Attending: Nurse Practitioner | Admitting: Nurse Practitioner

## 2022-01-10 ENCOUNTER — Ambulatory Visit (INDEPENDENT_AMBULATORY_CARE_PROVIDER_SITE_OTHER): Admitting: Nurse Practitioner

## 2022-01-10 VITALS — BP 110/62 | HR 67 | Temp 97.9°F | Ht 66.0 in | Wt 157.0 lb

## 2022-01-10 DIAGNOSIS — Z113 Encounter for screening for infections with a predominantly sexual mode of transmission: Secondary | ICD-10-CM | POA: Insufficient documentation

## 2022-01-10 DIAGNOSIS — Z0289 Encounter for other administrative examinations: Secondary | ICD-10-CM

## 2022-01-10 DIAGNOSIS — Z23 Encounter for immunization: Secondary | ICD-10-CM

## 2022-01-10 DIAGNOSIS — F3160 Bipolar disorder, current episode mixed, unspecified: Secondary | ICD-10-CM

## 2022-01-10 NOTE — Patient Instructions (Addendum)
Preventing Sexually Transmitted Infections, Adult Sexually transmitted infections (STIs) are spread from person to person (are contagious). They are spread, or transmitted, through bodily fluids exchanged during sex or sexual contact. These bodily fluids include saliva, semen, blood, vaginal mucus, and urine. STIs are very common and can occur among people of all ages. Some common STIs include: Herpes. Hepatitis B. Chlamydia. Gonorrhea. Syphilis. HPV (human papillomavirus). HIV, also called the human immunodeficiency virus. This is the virus that can cause AIDS (acquired immunodeficiency syndrome). Often, people who have these STIs do not have symptoms. Even without symptoms, these infections can be spread from person to person and require treatment. How can these conditions affect me? STIs can be treated, and many STIs can be cured. However, some STIs cannot be cured and will affect you for the rest of your life. Certain STIs may: Require you to take medicine for the rest of your life. Affect your ability to have children (your fertility). Increase your risk for developing another STI or certain serious health conditions. These may include: Cervical cancer. Head and neck cancer. Pelvic inflammatory disease (PID), in women. Organ damage or damage to other parts of your body, if the infection spreads. Cause problems during pregnancy and may be transmitted to the baby during the pregnancy or childbirth. What can increase my risk? You may have an increased risk for developing an STI if: You have unprotected sex or sexual contact. You have more than one sex partner. You have a sex partner who has multiple sex partners. You have sexual contact with someone who has an STI. You have an STI or you had an STI before. You inject drugs or have a sex partner or partners who inject drugs. What actions can I take to prevent STIs? The only way to completely prevent STIs is not to have sex of any  kind. This is called practicing abstinence. If you are sexually active, you can protect yourself and others by taking these actions to lower your risk of getting an STI: Lifestyle Avoid mixing alcohol, drugs, and sex. Alcohol and drug use can affect your ability to make good decisions and can lead to risky sexual behaviors. Medicines Ask your health care provider about taking pre-exposure prophylaxis (PrEP) to prevent HIV infection. General information  Stay up to date on vaccinations. Certain vaccines can lower your risk of getting certain STIs, such as: Hepatitis A and hepatitis B vaccines. HPV (human papillomavirus) vaccine. Have only one sex partner (be monogamous) or limit the number of sex partners you have. Use methods that prevent the exchange of body fluids between partners (barrier protection) correctly every time you have sexual contact. Barrier protection can be used during oral, vaginal, or anal sex. Commonly used barrier methods include: External (female) condom. Internal (female) condom. Dental dam. Use a new barrier method for every sex act from start to finish. Get tested for STIs. Have your partners get tested, too. If you test positive for an STI, follow recommendations from your health care provider about treatment and make sure your sex partners are tested and treated. Birth control pills, injections, implants, and intrauterine devices (IUDs) do not protect against STIs. To prevent both STIs and pregnancy, always use a condom with another form of birth control. Some STIs, such as herpes, are spread through skin-to-skin contact. A barrier method may not protect you from getting such STIs. Avoid all sexual contact if you or your partners have herpes and there is an active flare with open sores. Where to   find more information Learn more about STIs from: Centers for Disease Control and Prevention: More information about specific STIs: BloggingList.ca Places to get sexual health  counseling and treatment for free or at a low cost: gettested.TonerPromos.no U.S. Department of Health and Human Services: http://hoffman.com/ Summary Sexually transmitted infections (STIs) can spread through exchanging bodily fluids during sexual contact. Fluids include saliva, semen, blood, vaginal mucus, and urine. You may have an increased risk for developing an STI if you have unprotected sex. If you do have sex, limit your number of sex partners and use barrier protection every time you have sex. This information is not intended to replace advice given to you by your health care provider. Make sure you discuss any questions you have with your health care provider. Document Revised: 08/30/2021 Document Reviewed: 06/17/2019 Elsevier Patient Education  2023 Elsevier Inc.    Oscillococcinum to help with flu like symptoms. Also take tylenol for body aches

## 2022-01-10 NOTE — Progress Notes (Unsigned)
I,Tianna Badgett,acting as a Neurosurgeon for [employer], FNP.,have documented all relevant documentation on the behalf of Arnette Felts, FNP,as directed by  Arnette Felts, FNP while in the presence of Arnette Felts, FNP.  Subjective:     Patient ID: Yvonne Christensen , female    DOB: 20-Mar-1996 , 26 y.o.   MRN: [id number]   Chief Complaint  Patient presents with   SEXUALLY TRANSMITTED DISEASE    HPI  Patient presents today for STD screening. 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      Past Medical History:  Diagnosis Date   Bacterial vaginosis 03/2016   being treated, dosnot remember med name   Decreased fetal movement in pregnancy, antepartum 04/23/2016   Leakage of amniotic fluid 03/25/2016   Lupus ([facility])    Sjogren's syndrome ([facility])      Family History  Problem Relation Age of Onset   Breast cancer Mother    Bipolar disorder Mother    Cirrhosis Father    ADD / ADHD Sister    Hypertension Maternal Grandmother    Prostate cancer Maternal Grandfather    Bipolar disorder Paternal Grandmother      Current Outpatient Medications:    ARIPiprazole (ABILIFY) 5 MG tablet, Take 1 tablet (5 mg total) by mouth daily., Disp: 90 tablet, Rfl: 0   azaTHIOprine (IMURAN) 50 MG tablet, Take 100 mg by mouth daily as needed., Disp: , Rfl:    escitalopram (LEXAPRO) 20 MG tablet, Take 1 tablet (20 mg total) by mouth daily., Disp: 90 tablet, Rfl: 0   hydroxychloroquine (PLAQUENIL) 200 MG tablet, Take 200 mg by mouth daily., Disp: , Rfl:    traZODone (DESYREL) 100 MG tablet, Take 1 tablet (100 mg total) by mouth at bedtime., Disp: 90 tablet, Rfl: 0   triamcinolone cream (KENALOG) 0.1 %, Apply 1 application topically 2 (two) times daily., Disp: 45 g, Rfl: 3   Vitamin D, Ergocalciferol, (DRISDOL) 1.25 MG (50000 UNIT) CAPS capsule, Take 50,000 Units by mouth once a week., Disp: , Rfl:    Allergies  Allergen Reactions   Penicillins  Rash   Latex      Review of Systems  Constitutional: Negative.   Respiratory: Negative.    Cardiovascular: Negative.   Gastrointestinal: Negative.   Neurological: Negative.   Psychiatric/Behavioral: Negative.       Today's Vitals   01/10/22 1051  BP: 110/62  Pulse: 67  Temp: 97.9 F (36.6 C)  TempSrc: Oral  Weight: 157 lb (71.2 kg)  Height: 5\' 6"  (1.676 m)   Body mass index is 25.34 kg/m.  Wt Readings from Last 3 Encounters:  01/10/22 157 lb (71.2 kg)  11/04/21 162 lb (73.5 kg)  08/19/21 155 lb (70.3 kg)    Objective:  Physical Exam Vitals reviewed.  Constitutional:      General: She is not in acute distress.    Appearance: Normal appearance.  Pulmonary:     Effort: Pulmonary effort is normal. No respiratory distress.  Neurological:     General: No focal deficit present.     Mental Status: She is alert and oriented to person, place, and time.     Cranial Nerves: No cranial nerve deficit.     Motor: No weakness.  Psychiatric:        Mood and Affect: Mood normal.        Behavior: Behavior normal.        Thought Content: Thought  content normal.        Judgment: Judgment normal.         Assessment And Plan:     1. Mixed bipolar I disorder ([facility]) Comments: Currently being followed by psychiatry and counseling, well controlled and managed.  2. Encounter for completion of form with patient Form completed for employment  3. Screening for STD (sexually transmitted disease) - Urine cytology ancillary only  4. Need for influenza vaccination Influenza vaccine administered Encouraged to take Tylenol as needed for fever or muscle aches. - Flu Vaccine QUAD 6+ mos PF IM (Fluarix Quad PF)     Patient was given opportunity to ask questions. Patient verbalized understanding of the plan and was able to repeat key elements of the plan. All questions were answered to their satisfaction.  Arnette Felts, FNP   I, Arnette Felts, FNP, have reviewed all documentation for  this visit. The documentation on 01/10/22 for the exam, diagnosis, procedures, and orders are all accurate and complete.   IF YOU HAVE BEEN REFERRED TO A SPECIALIST, IT MAY TAKE 1-2 WEEKS TO SCHEDULE/PROCESS THE REFERRAL. IF YOU HAVE NOT HEARD FROM US/SPECIALIST IN TWO WEEKS, PLEASE GIVE [place] A CALL AT [phone].   THE PATIENT IS ENCOURAGED TO PRACTICE SOCIAL DISTANCING DUE TO THE COVID-19 PANDEMIC.

## 2022-01-12 ENCOUNTER — Ambulatory Visit: Admitting: Nurse Practitioner

## 2022-01-12 LAB — URINE CYTOLOGY ANCILLARY ONLY
Bacterial Vaginitis-Urine: NEGATIVE
Candida Urine: NEGATIVE
Chlamydia: NEGATIVE
Comment: NEGATIVE
Comment: NEGATIVE
Comment: NORMAL
Neisseria Gonorrhea: NEGATIVE
Trichomonas: NEGATIVE

## 2022-01-19 ENCOUNTER — Telehealth (HOSPITAL_BASED_OUTPATIENT_CLINIC_OR_DEPARTMENT_OTHER): Admitting: Psychiatry

## 2022-01-19 ENCOUNTER — Encounter (HOSPITAL_COMMUNITY): Payer: Self-pay | Admitting: Psychiatry

## 2022-01-19 VITALS — Wt 157.0 lb

## 2022-01-19 DIAGNOSIS — F3160 Bipolar disorder, current episode mixed, unspecified: Secondary | ICD-10-CM

## 2022-01-19 DIAGNOSIS — F419 Anxiety disorder, unspecified: Secondary | ICD-10-CM | POA: Diagnosis not present

## 2022-01-19 MED ORDER — ARIPIPRAZOLE 5 MG PO TABS: 5.0000 mg | ORAL_TABLET | Freq: Every day | ORAL | 0 refills | Status: DC

## 2022-01-19 MED ORDER — ESCITALOPRAM OXALATE 20 MG PO TABS: 20.0000 mg | ORAL_TABLET | Freq: Every day | ORAL | 0 refills | Status: DC

## 2022-01-19 MED ORDER — TRAZODONE HCL 100 MG PO TABS: 100.0000 mg | ORAL_TABLET | Freq: Every day | ORAL | 0 refills | Status: DC

## 2022-01-19 NOTE — Progress Notes (Signed)
Virtual Visit via Video Note  I connected with Yvonne Christensen on 01/19/22 at 10:40 AM EDT by a video enabled telemedicine application and verified that I am speaking with the correct person using two identifiers.  Location: Patient: Work Provider: Economist   I discussed the limitations of evaluation and management by telemedicine and the availability of in person appointments. The patient expressed understanding and agreed to proceed.  History of Present Illness: Patient is evaluated by video session.  She is at work.  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  In the beginning she has a lot of anxiety but she is adjusting.  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  Her daughter started kindergarten.  She has not smoked marijuana since the last visit.  Her anxiety is slowly getting better.  She is in therapy with Yvonne Christensen twice a week.  She is sleeping better.  She denies any mood swing, mania, anger, impulsive behavior or any suicidal thoughts.  She started work out in the morning when she has time before she go to work.  Her energy level is good.  She denies any panic attack.  Patient lives with her daughter.  Currently she is not in any relationship.  She would like to keep the current medication.   Past Psychiatric History: H/O mood swings, anger, depression, anxiety and impulsive decisions. Had significant guilt after an abortion. Multiple speeding tickets. No h/o inpatient treatment or suicidal attempt.  PCP started Lexapro.   Psychiatric Specialty Exam: Physical Exam  Review of Systems  Weight 157 lb (71.2 kg).There is no height or weight on file to calculate BMI.  General Appearance: Casual  Eye Contact:  Good  Speech:  Clear and Coherent and Normal Rate  Volume:  Normal  Mood:  Euthymic  Affect:   Congruent  Thought Process:  Goal Directed  Orientation:  Full (Time, Place, and Person)  Thought Content:  Logical  Suicidal Thoughts:  No  Homicidal Thoughts:  No  Memory:  Immediate;   Good Recent;   Good Remote;   Good  Judgement:  Good  Insight:  Present  Psychomotor Activity:  Normal  Concentration:  Concentration: Good and Attention Span: Good  Recall:  Good  Fund of Knowledge:  Good  Language:  Good  Akathisia:  No  Handed:  Right  AIMS (if indicated):     Assets:  Communication Skills Desire for Improvement Financial Resources/Insurance Housing Resilience Talents/Skills Transportation  ADL's:  Intact  Cognition:  WNL  Sleep:   ok      Assessment and Plan: Bipolar disorder, mixed type.  Anxiety.  Patient compliant with medication and so far no major concern or side effects.  She started the new job and adjusting better after having some anxiety in the beginning of the job.  She like to keep her current medication.  Continue trazodone 100 mg at bedtime, Lexapro 20 mg daily and Abilify 5 mg daily.  She is also in therapy with Yvonne Christensen 2 times a week.  Discussed medication side effects and benefits.  Recommended to call us back if she has any question or any concern.  Follow-up in 3 months.  Follow Up Instructions:    I discussed the assessment and treatment plan with the patient. The patient was provided  an opportunity to ask questions and all were answered. The patient agreed with the plan and demonstrated an understanding of the instructions.   The patient was advised to call back or seek an in-person evaluation if the symptoms worsen or if the condition fails to improve as anticipated.  Collaboration of Care: Other provider involved in patient's care AEB notes are available in epic to review  Patient/Guardian was advised Release of Information must be obtained prior to any record release in order to collaborate their care with an outside provider.  Patient/Guardian was advised if they have not already done so to contact the registration department to sign all necessary forms in order for [place] to release information regarding their care.   Consent: Patient/Guardian gives verbal consent for treatment and assignment of benefits for services provided during this visit. Patient/Guardian expressed understanding and agreed to proceed.    I provided 13 minutes of non-face-to-face time during this encounter.   Cleotis Nipper, MD

## 2022-04-20 ENCOUNTER — Telehealth (HOSPITAL_BASED_OUTPATIENT_CLINIC_OR_DEPARTMENT_OTHER): Admitting: Psychiatry

## 2022-04-20 ENCOUNTER — Encounter (HOSPITAL_COMMUNITY): Payer: Self-pay | Admitting: Psychiatry

## 2022-04-20 DIAGNOSIS — F3160 Bipolar disorder, current episode mixed, unspecified: Secondary | ICD-10-CM | POA: Diagnosis not present

## 2022-04-20 DIAGNOSIS — F419 Anxiety disorder, unspecified: Secondary | ICD-10-CM | POA: Diagnosis not present

## 2022-04-20 MED ORDER — ESCITALOPRAM OXALATE 20 MG PO TABS: 20.0000 mg | ORAL_TABLET | Freq: Every day | ORAL | 0 refills | Status: AC

## 2022-04-20 MED ORDER — TRAZODONE HCL 100 MG PO TABS: 100.0000 mg | ORAL_TABLET | Freq: Every day | ORAL | 0 refills | Status: AC

## 2022-04-20 MED ORDER — ARIPIPRAZOLE 10 MG PO TABS: 10.0000 mg | ORAL_TABLET | Freq: Every day | ORAL | 0 refills | Status: DC

## 2022-04-20 NOTE — Progress Notes (Signed)
Virtual Visit via Telephone Note  I connected with Yvonne Christensen on 04/20/22 at 10:00 AM EST by telephone and verified that I am speaking with the correct person using two identifiers.  Location: Patient: In Car Provider: Home Office   I discussed the limitations, risks, security and privacy concerns of performing an evaluation and management service by telephone and the availability of in person appointments. I also discussed with the patient that there may be a patient responsible charge related to this service. The patient expressed understanding and agreed to proceed.   History of Present Illness: Patient is evaluated by phone session.  She is driving and unable to do the video.  Patient is going to her daughter's appointment to see rheumatologist.  Patient told she is not doing very well and she feels medicine is not working as good.  She started to have again irritability, anger, mood swings.  She gets easily frustrated.  She did not sleep all night and reported lately decrease in appetite and may have lost weight but she do not have any scale to check her weight.  She reported she liked the job but there is a lot of politics and favoritism which she do not like.  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  She reported job does not pay well and she is behind the bills.  She is in therapy with [name].  She denies any hallucination, paranoia, suicidal thoughts but admitted more irritable frustrated and anger.  She is compliant with Lexapro, trazodone and Abilify.  Patient is living with her daughter.  Currently she is not in any relationship.  She denies cannabis use for a while.  She has no tremors, shakes or any EPS.  She reported anxiety due to finances but denies any panic attack.     Past Psychiatric History: H/O mood swings, anger, depression, anxiety and impulsive decisions. Had significant guilt after an abortion. Multiple speeding tickets. No h/o  inpatient treatment or suicidal attempt.  PCP started Lexapro.   Psychiatric Specialty Exam: Physical Exam  Review of Systems  There were no vitals taken for this visit.There is no height or weight on file to calculate BMI.  General Appearance: NA  Eye Contact:  NA  Speech:  Normal Rate  Volume:  Normal  Mood:  Depressed and Irritable  Affect:  NA  Thought Process:  Descriptions of Associations: Intact  Orientation:  Full (Time, Place, and Person)  Thought Content:  Rumination  Suicidal Thoughts:  No  Homicidal Thoughts:  No  Memory:  Immediate;   Good Recent;   Good Remote;   Good  Judgement:  Intact  Insight:  Present  Psychomotor Activity:  NA  Concentration:  Concentration: Good and Attention Span: Good  Recall:  Good  Fund of Knowledge:  Good  Language:  Good  Akathisia:  No  Handed:  Right  AIMS (if indicated):     Assets:  Communication Skills Desire for Improvement Housing Talents/Skills Transportation  ADL's:  Intact  Cognition:  WNL  Sleep:   fair      Assessment and Plan: Bipolar disorder, mixed type.  Anxiety.  Discussed her symptoms.  Patient feels the Abilify is not strong and wondering if she can go up the dose.  I agree with the plan.  We will try Abilify 10 mg daily and continue Lexapro 20 mg daily and trazodone 100 mg at bedtime.  Encourage therapy with [name] more frequent to  address her coping skills.  Discussed medication side effects and benefits.  Recommend to call us back if there is any question or any concern.  Follow-up in 3 months.  Follow Up Instructions:    I discussed the assessment and treatment plan with the patient. The patient was provided an opportunity to ask questions and all were answered. The patient agreed with the plan and demonstrated an understanding of the instructions.   The patient was advised to call back or seek an in-person evaluation if the symptoms worsen or if the condition fails to improve as  anticipated.  Collaboration of Care: Other provider involved in patient's care AEB notes are available in epic to review.  Patient/Guardian was advised Release of Information must be obtained prior to any record release in order to collaborate their care with an outside provider. Patient/Guardian was advised if they have not already done so to contact the registration department to sign all necessary forms in order for [place] to release information regarding their care.   Consent: Patient/Guardian gives verbal consent for treatment and assignment of benefits for services provided during this visit. Patient/Guardian expressed understanding and agreed to proceed.    I provided 13 minutes of non-face-to-face time during this encounter.   Cleotis Nipper, MD

## 2022-05-12 ENCOUNTER — Encounter: Payer: Self-pay | Admitting: Nurse Practitioner

## 2022-05-12 ENCOUNTER — Telehealth (INDEPENDENT_AMBULATORY_CARE_PROVIDER_SITE_OTHER): Admitting: Nurse Practitioner

## 2022-05-12 DIAGNOSIS — F419 Anxiety disorder, unspecified: Secondary | ICD-10-CM | POA: Diagnosis not present

## 2022-05-12 DIAGNOSIS — M3501 Sicca syndrome with keratoconjunctivitis: Secondary | ICD-10-CM

## 2022-05-12 DIAGNOSIS — E559 Vitamin D deficiency, unspecified: Secondary | ICD-10-CM

## 2022-05-12 DIAGNOSIS — F3160 Bipolar disorder, current episode mixed, unspecified: Secondary | ICD-10-CM | POA: Diagnosis not present

## 2022-05-12 NOTE — Progress Notes (Signed)
Virtual Visit via MyChart   This visit type was conducted due to national recommendations for restrictions regarding the COVID-19 Pandemic (e.g. social distancing) in an effort to limit this patient's exposure and mitigate transmission in our community.  Due to her co-morbid illnesses, this patient is at least at moderate risk for complications without adequate follow up.  This format is felt to be most appropriate for this patient at this time.  All issues noted in this document were discussed and addressed.  A limited physical exam was performed with this format.    This visit type was conducted due to national recommendations for restrictions regarding the COVID-19 Pandemic (e.g. social distancing) in an effort to limit this patient's exposure and mitigate transmission in our community.  Patients identity confirmed using two different identifiers.  This format is felt to be most appropriate for this patient at this time.  All issues noted in this document were discussed and addressed.  No physical exam was performed (except for noted visual exam findings with Video Visits).    Date:  05/12/2022   ID:  Yvonne Christensen, DOB Jan 13, 1996, MRN [id number]  Patient Location:  Home - spoke with Azucena Cecil  Provider location:   Office    Chief Complaint:  f/u anxiety and depression  History of Present Illness:    Yvonne Christensen is a 26 y.o. female who presents via video conferencing for a telehealth visit today.    The patient does not have symptoms concerning for COVID-19 infection (fever, chills, cough, or new shortness of breath).   Patient presents today for anxiety follow up.  She does not have any specific questions or concerns.  Continues to see the psychiatrist  Continues to see the Rheumatologist at the [place]. Seen earlier this month   Anxiety Presents for follow-up visit. Patient reports no chest pain.       Past Medical History:  Diagnosis Date   Bacterial vaginosis  03/2016   being treated, dosnot remember med name   Decreased fetal movement in pregnancy, antepartum 04/23/2016   Leakage of amniotic fluid 03/25/2016   Lupus ([place])    Sjogren's syndrome ([place])    Past Surgical History:  Procedure Laterality Date   CESAREAN SECTION  05/12/2016   WISDOM TOOTH EXTRACTION  2013   g/a     Current Meds  Medication Sig   ARIPiprazole (ABILIFY) 10 MG tablet Take 1 tablet (10 mg total) by mouth daily.   azaTHIOprine (IMURAN) 50 MG tablet Take 100 mg by mouth daily as needed.   escitalopram (LEXAPRO) 20 MG tablet Take 1 tablet (20 mg total) by mouth daily.   hydroxychloroquine (PLAQUENIL) 200 MG tablet Take 200 mg by mouth daily.   traZODone (DESYREL) 100 MG tablet Take 1 tablet (100 mg total) by mouth at bedtime.   triamcinolone cream (KENALOG) 0.1 % Apply 1 application topically 2 (two) times daily.   Vitamin D, Ergocalciferol, (DRISDOL) 1.25 MG (50000 UNIT) CAPS capsule Take 50,000 Units by mouth once a week.     Allergies:   Penicillins and Latex   Social History   Tobacco Use   Smoking status: Never   Smokeless tobacco: Never  Vaping Use   Vaping Use: Never used  Substance Use Topics   Alcohol use: Not Currently   Drug use: No     Family Hx: The patient's family history includes ADD / ADHD in her sister; Bipolar disorder in her mother and paternal grandmother; Breast cancer in her mother;  Cirrhosis in her father; Hypertension in her maternal grandmother; Prostate cancer in her maternal grandfather.  ROS:   Please see the history of present illness.    Review of Systems  Constitutional: Negative.   Respiratory: Negative.    Cardiovascular:  Negative for chest pain.  Genitourinary: Negative.   Skin: Negative.   Neurological: Negative.   Endo/Heme/Allergies: Negative.   Psychiatric/Behavioral: Negative.      All other systems reviewed and are negative.   Labs/Other Tests and Data Reviewed:    Recent Labs: 06/01/2021: TSH  1.410 11/04/2021: ALT 11; BUN 11; Creatinine, Ser 0.86; Hemoglobin 13.7; Platelets 314; Potassium 4.2; Sodium 140   Recent Lipid Panel Lab Results  Component Value Date/Time   CHOL 143 11/04/2021 11:27 AM   TRIG 36 11/04/2021 11:27 AM   HDL 65 11/04/2021 11:27 AM   CHOLHDL 2.2 11/04/2021 11:27 AM   LDLCALC 69 11/04/2021 11:27 AM    Wt Readings from Last 3 Encounters:  01/10/22 157 lb (71.2 kg)  11/04/21 162 lb (73.5 kg)  08/19/21 155 lb (70.3 kg)     Exam:    Vital Signs:  There were no vitals taken for this visit.    Physical Exam Vitals reviewed.  Constitutional:      General: She is not in acute distress.    Appearance: Normal appearance.  Pulmonary:     Effort: Pulmonary effort is normal. No respiratory distress.  Neurological:     General: No focal deficit present.     Mental Status: She is alert and oriented to person, place, and time. Mental status is at baseline.     Cranial Nerves: No cranial nerve deficit.  Psychiatric:        Mood and Affect: Mood and affect normal.        Behavior: Behavior normal.        Thought Content: Thought content normal.        Cognition and Memory: Memory normal.        Judgment: Judgment normal.     ASSESSMENT & PLAN:    1. Anxiety She feels like she is doing better, continue f/u with psychiatry  - CMP14+EGFR  2. 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 Depression screen is 6.  - CMP14+EGFR  3. Sjogren syndrome with keratoconjunctivitis ([place]) Continue f/u with Rheumatology at [facility]   4. Vitamin D deficiency Continue vitamin d, will check levels  - Vitamin D (25 hydroxy)   COVID-19 Education: The signs and symptoms of COVID-19 were discussed with the patient and how to seek care for testing (follow up with PCP or arrange E-visit).  The importance of social distancing was discussed today.  Patient Risk:   After full review of this patients clinical status, I  feel that they are at least moderate risk at this time.  Time:   Today, I have spent 8 minutes/ seconds with the patient with telehealth technology discussing above diagnoses.     Medication Adjustments/Labs and Tests Ordered: Current medicines are reviewed at length with the patient today.  Concerns regarding medicines are outlined above.   Tests Ordered: Orders Placed This Encounter  Procedures   Vitamin D (25 hydroxy)   CMP14+EGFR    Medication Changes: No orders of the defined types were placed in this encounter.   Disposition:  Follow up prn, has physical appt in June 2024  Signed, Minette Brine, FNP

## 2022-05-12 NOTE — Patient Instructions (Signed)

## 2022-06-07 ENCOUNTER — Encounter: Payer: Self-pay | Admitting: Nurse Practitioner

## 2022-06-07 DIAGNOSIS — Z111 Encounter for screening for respiratory tuberculosis: Secondary | ICD-10-CM

## 2022-07-20 ENCOUNTER — Telehealth (HOSPITAL_COMMUNITY): Admitting: Psychiatry

## 2022-08-10 DIAGNOSIS — F3160 Bipolar disorder, current episode mixed, unspecified: Secondary | ICD-10-CM | POA: Insufficient documentation

## 2022-08-10 DIAGNOSIS — M222X9 Patellofemoral disorders, unspecified knee: Secondary | ICD-10-CM | POA: Insufficient documentation

## 2022-08-10 DIAGNOSIS — M25559 Pain in unspecified hip: Secondary | ICD-10-CM | POA: Insufficient documentation

## 2022-08-10 DIAGNOSIS — F319 Bipolar disorder, unspecified: Secondary | ICD-10-CM | POA: Insufficient documentation

## 2022-08-11 ENCOUNTER — Other Ambulatory Visit (HOSPITAL_COMMUNITY)
Admission: RE | Admit: 2022-08-11 | Discharge: 2022-08-11 | Disposition: A | Discharge status: Home / Self Care | Source: Ambulatory Visit | Attending: Nurse Practitioner | Admitting: Nurse Practitioner

## 2022-08-11 ENCOUNTER — Encounter: Payer: Self-pay | Admitting: Nurse Practitioner

## 2022-08-11 ENCOUNTER — Ambulatory Visit (INDEPENDENT_AMBULATORY_CARE_PROVIDER_SITE_OTHER): Admitting: Nurse Practitioner

## 2022-08-11 VITALS — BP 96/62 | HR 65 | Temp 98.3°F | Ht 66.0 in | Wt 159.3 lb

## 2022-08-11 DIAGNOSIS — Z1159 Encounter for screening for other viral diseases: Secondary | ICD-10-CM | POA: Diagnosis not present

## 2022-08-11 DIAGNOSIS — Z114 Encounter for screening for human immunodeficiency virus [HIV]: Secondary | ICD-10-CM | POA: Diagnosis not present

## 2022-08-11 DIAGNOSIS — Z113 Encounter for screening for infections with a predominantly sexual mode of transmission: Secondary | ICD-10-CM | POA: Diagnosis not present

## 2022-08-11 DIAGNOSIS — F319 Bipolar disorder, unspecified: Secondary | ICD-10-CM

## 2022-08-11 NOTE — Progress Notes (Signed)
I,Sheena H Holbrook,acting as a Education administrator for Minette Brine, FNP.,have documented all relevant documentation on the behalf of Minette Brine, FNP,as directed by  Minette Brine, FNP while in the presence of Minette Brine, [place].    Subjective:     Patient ID: Yvonne Christensen , female    DOB: 1995-09-22 , 27 y.o.   MRN: [id number]   No chief complaint on file.   HPI  Patient presents today for . STD testing      Past Medical History:  Diagnosis Date   Bacterial vaginosis 03/2016   being treated, dosnot remember med name   Decreased fetal movement in pregnancy, antepartum 04/23/2016   Leakage of amniotic fluid 03/25/2016   Lupus ([place])    Sjogren's syndrome ([place])      Family History  Problem Relation Age of Onset   Breast cancer Mother    Bipolar disorder Mother    Cirrhosis Father    ADD / ADHD Sister    Hypertension Maternal Grandmother    Prostate cancer Maternal Grandfather    Bipolar disorder Paternal Grandmother      Current Outpatient Medications:    ARIPiprazole (ABILIFY) 10 MG tablet, Take 1 tablet (10 mg total) by mouth daily., Disp: 90 tablet, Rfl: 0   azaTHIOprine (IMURAN) 50 MG tablet, Take 100 mg by mouth daily as needed., Disp: , Rfl:    escitalopram (LEXAPRO) 20 MG tablet, Take 1 tablet (20 mg total) by mouth daily., Disp: 90 tablet, Rfl: 0   hydroxychloroquine (PLAQUENIL) 200 MG tablet, Take 200 mg by mouth daily., Disp: , Rfl:    traZODone (DESYREL) 100 MG tablet, Take 1 tablet (100 mg total) by mouth at bedtime., Disp: 90 tablet, Rfl: 0   triamcinolone cream (KENALOG) 0.1 %, Apply 1 application topically 2 (two) times daily., Disp: 45 g, Rfl: 3   Vitamin D, Ergocalciferol, (DRISDOL) 1.25 MG (50000 UNIT) CAPS capsule, Take 50,000 Units by mouth once a week., Disp: , Rfl:    Allergies  Allergen Reactions   Penicillins Rash   Latex      Review of Systems  All other systems reviewed and are negative.    Today's Vitals   08/11/22 1229  BP: 96/62  Pulse:  65  Temp: 98.3 F (36.8 C)  TempSrc: Oral  SpO2: 97%  Weight: 159 lb 4.8 oz (72.3 kg)  Height: 5\' 6"  (1.676 m)   Body mass index is 25.71 kg/m.   Objective:  Physical Exam Vitals reviewed.  Constitutional:      General: She is not in acute distress.    Appearance: Normal appearance.  Pulmonary:     Effort: Pulmonary effort is normal. No respiratory distress.  Neurological:     General: No focal deficit present.     Mental Status: She is alert and oriented to person, place, and time.     Cranial Nerves: No cranial nerve deficit.     Motor: No weakness.  Psychiatric:        Mood and Affect: Mood normal.        Behavior: Behavior normal.        Thought Content: Thought content normal.        Judgment: Judgment normal.         Assessment And Plan:     1. Bipolar I disorder ([place]) Comments: Continue f/u with Behavioral Health  2. Screening for STD (sexually transmitted disease) Comments: No current symptoms, would like surveillance screening test - HSV-2 Ab, IgG - Urine cytology  ancillary only - Hepatitis B surface antigen - RPR  3. Encounter for HIV (human immunodeficiency virus) test - HIV Antibody (routine testing w rflx)  4. Encounter for hepatitis C screening test for low risk patient Will check Hepatitis C screening due to recent recommendations to screen all adults 18 years and older - Hepatitis C antibody    Patient was given opportunity to ask questions. Patient verbalized understanding of the plan and was able to repeat key elements of the plan. All questions were answered to their satisfaction.  Minette Brine, FNP   I, Minette Brine, FNP, have reviewed all documentation for this visit. The documentation on 08/11/22 for the exam, diagnosis, procedures, and orders are all accurate and complete.   IF YOU HAVE BEEN REFERRED TO A SPECIALIST, IT MAY TAKE 1-2 WEEKS TO SCHEDULE/PROCESS THE REFERRAL. IF YOU HAVE NOT HEARD FROM US/SPECIALIST IN TWO WEEKS, PLEASE  GIVE [place] A CALL AT [phone].   THE PATIENT IS ENCOURAGED TO PRACTICE SOCIAL DISTANCING DUE TO THE COVID-19 PANDEMIC.

## 2022-08-12 LAB — URINE CYTOLOGY ANCILLARY ONLY
Bacterial Vaginitis-Urine: NEGATIVE
Candida Urine: NEGATIVE
Chlamydia: NEGATIVE
Comment: NEGATIVE
Comment: NEGATIVE
Comment: NORMAL
Neisseria Gonorrhea: NEGATIVE
Trichomonas: NEGATIVE

## 2022-08-12 LAB — HIV ANTIBODY (ROUTINE TESTING W REFLEX): HIV Screen 4th Generation wRfx: NONREACTIVE

## 2022-08-12 LAB — HEPATITIS C ANTIBODY: Hep C Virus Ab: NONREACTIVE

## 2022-08-12 LAB — HEPATITIS B SURFACE ANTIGEN: Hepatitis B Surface Ag: NEGATIVE

## 2022-08-12 LAB — RPR: RPR Ser Ql: NONREACTIVE

## 2022-08-12 LAB — HSV-2 AB, IGG: HSV 2 IgG, Type Spec: <0.91 index (ref 0.00–0.90)

## 2022-11-09 ENCOUNTER — Encounter: Payer: Self-pay | Admitting: Nurse Practitioner

## 2022-11-09 ENCOUNTER — Ambulatory Visit (INDEPENDENT_AMBULATORY_CARE_PROVIDER_SITE_OTHER): Admitting: Nurse Practitioner

## 2022-11-09 VITALS — BP 110/70 | HR 88 | Temp 98.2°F | Ht 66.0 in | Wt 159.8 lb

## 2022-11-09 DIAGNOSIS — Z13220 Encounter for screening for lipoid disorders: Secondary | ICD-10-CM

## 2022-11-09 DIAGNOSIS — F3160 Bipolar disorder, current episode mixed, unspecified: Secondary | ICD-10-CM

## 2022-11-09 DIAGNOSIS — Z803 Family history of malignant neoplasm of breast: Secondary | ICD-10-CM

## 2022-11-09 DIAGNOSIS — L930 Discoid lupus erythematosus: Secondary | ICD-10-CM

## 2022-11-09 DIAGNOSIS — F419 Anxiety disorder, unspecified: Secondary | ICD-10-CM

## 2022-11-09 DIAGNOSIS — E559 Vitamin D deficiency, unspecified: Secondary | ICD-10-CM | POA: Diagnosis not present

## 2022-11-09 DIAGNOSIS — Z2821 Immunization not carried out because of patient refusal: Secondary | ICD-10-CM

## 2022-11-09 DIAGNOSIS — M3501 Sicca syndrome with keratoconjunctivitis: Secondary | ICD-10-CM

## 2022-11-09 DIAGNOSIS — Z79899 Other long term (current) drug therapy: Secondary | ICD-10-CM

## 2022-11-09 DIAGNOSIS — Z0000 Encounter for general adult medical examination without abnormal findings: Secondary | ICD-10-CM

## 2022-11-09 NOTE — Assessment & Plan Note (Signed)
Will check vitamin D level and supplement as needed.    Also encouraged to spend 15 minutes in the sun daily.   

## 2022-11-09 NOTE — Assessment & Plan Note (Signed)
Continue f/u with Rheumatology

## 2022-11-09 NOTE — Assessment & Plan Note (Signed)
Declines covid 19 vaccine. Discussed risk of covid 57 and if she changes her mind about the vaccine to call the office. Education has been provided regarding the importance of this vaccine but patient still declined. Advised may receive this vaccine at local pharmacy or Health Dept.or vaccine clinic. Encouraged to take multivitamin, vitamin d, vitamin c and zinc to increase immune system. Aware can call office if would like to have vaccine here at office. Verbalized acceptance and understanding.

## 2022-11-09 NOTE — Assessment & Plan Note (Signed)
Continue f/u with Dermatology/Rheumatology at [facility]

## 2022-11-09 NOTE — Progress Notes (Signed)
I,Mylon Mabey,acting as a Neurosurgeon for Arnette Felts, FNP.,have documented all relevant documentation on the behalf of Arnette Felts, FNP,as directed by  Arnette Felts, FNP while in the presence of Arnette Felts, FNP.  Subjective:    Patient ID: Yvonne Christensen , female    DOB: [birth year] , 27 y.o.   MRN: [id number]  Chief Complaint  Patient presents with   Annual Exam    HPI  Patient presents today for a physical. She is no longer seeing Dr. Juanda Bond for her GYN needs, plans to see her provider at the [facility] to have done.  Patient does not have any questions or concerns at this time. She continues to see psychiatry at the [facility] and has her medications filled there. She also sees a therapist. [name]). Continues to see psychiatry at [facility], primary care and Rheumatology. She had her eye exam on 08/2022  Her mother was diagnosed with breast cancer at the age of 63   .p     Past Medical History:  Diagnosis Date   Bacterial vaginosis 03/2016   being treated, dosnot remember med name   Decreased fetal movement in pregnancy, antepartum 04/23/2016   Leakage of amniotic fluid 03/25/2016   Lupus (HCC)    Sjogren's syndrome (HCC)      Family History  Problem Relation Age of Onset   Breast cancer Mother    Bipolar disorder Mother    Cirrhosis Father    ADD / ADHD Sister    Hypertension Maternal Grandmother    Prostate cancer Maternal Grandfather    Bipolar disorder Paternal Grandmother      Current Outpatient Medications:    ARIPiprazole (ABILIFY) 10 MG tablet, Take 1 tablet (10 mg total) by mouth daily., Disp: 90 tablet, Rfl: 0   azaTHIOprine (IMURAN) 50 MG tablet, Take 100 mg by mouth daily as needed., Disp: , Rfl:    escitalopram (LEXAPRO) 20 MG tablet, Take 1 tablet (20 mg total) by mouth daily., Disp: 90 tablet, Rfl: 0   hydroxychloroquine (PLAQUENIL) 200 MG tablet, Take 200 mg by mouth daily., Disp: , Rfl:    traZODone (DESYREL) 100 MG tablet, Take 1 tablet (100 mg total)  by mouth at bedtime., Disp: 90 tablet, Rfl: 0   triamcinolone cream (KENALOG) 0.1 %, Apply 1 application topically 2 (two) times daily., Disp: 45 g, Rfl: 3   Vitamin D, Ergocalciferol, (DRISDOL) 1.25 MG (50000 UNIT) CAPS capsule, Take 50,000 Units by mouth once a week., Disp: , Rfl:    Allergies  Allergen Reactions   Penicillins Rash   Latex       The patient states she uses none for birth control. No LMP recorded.. Negative for Dysmenorrhea and Negative for Menorrhagia. Negative for: breast discharge, breast lump(s), breast pain and breast self exam. Associated symptoms include abnormal vaginal bleeding. Pertinent negatives include abnormal bleeding (hematology), anxiety, decreased libido, depression, difficulty falling sleep, dyspareunia, history of infertility, nocturia, sexual dysfunction, sleep disturbances, urinary incontinence, urinary urgency, vaginal discharge and vaginal itching. Diet regular.  The patient states her exercise level is minimal with walking, with her job and with her daughter in the evening. She is working at [employer]   The patient's tobacco use is:  Social History   Tobacco Use  Smoking Status Never  Smokeless Tobacco Never   She has been exposed to passive smoke. The patient's alcohol use is:  Social History   Substance and Sexual Activity  Alcohol Use Not Currently   Additional information: Last pap  05/28/2019, next one scheduled for .    Review of Systems  Constitutional: Negative.   HENT: Negative.    Eyes: Negative.   Respiratory: Negative.    Cardiovascular: Negative.   Gastrointestinal: Negative.   Endocrine: Negative.   Genitourinary: Negative.   Musculoskeletal: Negative.   Skin: Negative.   Allergic/Immunologic: Negative.   Neurological: Negative.   Hematological: Negative.   Psychiatric/Behavioral: Negative.       Today's Vitals   11/09/22 0946  BP: 110/70  Pulse: 88  Temp: 98.2 F (36.8 C)  Weight: 159 lb 12.8 oz (72.5 kg)   Height: 5\' 6"  (1.676 m)  PainSc: 7   PainLoc: Abdomen   Body mass index is 25.79 kg/m.  Wt Readings from Last 3 Encounters:  11/09/22 159 lb 12.8 oz (72.5 kg)  08/11/22 159 lb 4.8 oz (72.3 kg)  01/10/22 157 lb (71.2 kg)     Objective:  Physical Exam Vitals reviewed.  Constitutional:      General: She is not in acute distress.    Appearance: Normal appearance. She is well-developed.  HENT:     Head: Normocephalic and atraumatic.     Right Ear: Hearing, tympanic membrane, ear canal and external ear normal. There is no impacted cerumen.     Left Ear: Hearing, tympanic membrane, ear canal and external ear normal. There is no impacted cerumen.     Nose: Nose normal.     Mouth/Throat:     Mouth: Mucous membranes are moist.  Eyes:     General: Lids are normal.     Extraocular Movements: Extraocular movements intact.     Conjunctiva/sclera: Conjunctivae normal.     Pupils: Pupils are equal, round, and reactive to light.     Funduscopic exam:    Right eye: No papilledema.        Left eye: No papilledema.  Neck:     Thyroid: No thyroid mass.     Vascular: No carotid bruit.  Cardiovascular:     Rate and Rhythm: Normal rate and regular rhythm.     Pulses: Normal pulses.     Heart sounds: Normal heart sounds. No murmur heard. Pulmonary:     Effort: Pulmonary effort is normal. No respiratory distress.     Breath sounds: Normal breath sounds. No wheezing.  Chest:     Chest wall: No mass.  Breasts:    Tanner Score is 5.     Right: Normal. No mass or tenderness.     Left: Normal. No mass or tenderness.  Abdominal:     General: Abdomen is flat. Bowel sounds are normal. There is no distension.     Palpations: Abdomen is soft.     Tenderness: There is no abdominal tenderness.  Genitourinary:    Rectum: Guaiac result negative.  Musculoskeletal:        General: No swelling. Normal range of motion.     Cervical back: Full passive range of motion without pain, normal range of  motion and neck supple.     Right lower leg: No edema.     Left lower leg: No edema.  Lymphadenopathy:     Upper Body:     Right upper body: No supraclavicular, axillary or pectoral adenopathy.     Left upper body: No supraclavicular, axillary or pectoral adenopathy.  Skin:    General: Skin is warm and dry.     Capillary Refill: Capillary refill takes less than 2 seconds.     Comments: Belly ring  Neurological:  General: No focal deficit present.     Mental Status: She is alert and oriented to person, place, and time.     Cranial Nerves: No cranial nerve deficit.     Sensory: No sensory deficit.  Psychiatric:        Mood and Affect: Mood normal.        Behavior: Behavior normal.        Thought Content: Thought content normal.        Judgment: Judgment normal.         Assessment And Plan:   Routine general medical examination at health care facility Assessment & Plan: Behavior modifications discussed and diet history reviewed.   Pt will continue to exercise regularly and modify diet with low GI, plant based foods and decrease intake of processed foods.  Recommend intake of daily multivitamin, Vitamin D, and calcium.  Recommend mammogram since her mother was diagnosed with breast cancer at age 74 for preventive screenings, as well as recommend immunizations that include influenza, TDAP    Vitamin D deficiency Assessment & Plan: Will check vitamin D level and supplement as needed.    Also encouraged to spend 15 minutes in the sun daily.    Orders: -     VITAMIN D 25 Hydroxy (Vit-D Deficiency, Fractures)  Mixed bipolar I disorder (HCC) Assessment & Plan: Continue f/u with Pyschiatrist at [facility]  Orders: -     Basic metabolic panel  Sjogren syndrome with keratoconjunctivitis (HCC) Assessment & Plan: Continue f/u with Rheumatology   COVID-19 vaccination declined Assessment & Plan: Declines covid 19 vaccine. Discussed risk of covid 50 and if she changes her mind  about the vaccine to call the office. Education has been provided regarding the importance of this vaccine but patient still declined. Advised may receive this vaccine at local pharmacy or Health Dept.or vaccine clinic. Encouraged to take multivitamin, vitamin d, vitamin c and zinc to increase immune system. Aware can call office if would like to have vaccine here at office. Verbalized acceptance and understanding.    Discoid lupus erythematosus Assessment & Plan: Continue f/u with Dermatology/Rheumatology at [facility]   Family history of breast cancer in mother -     3D Screening Mammogram, Left and Right; Future  Encounter for screening for lipid disorder -     Lipid panel  Other long term (current) drug therapy -     CBC  Anxiety Assessment & Plan: Continue f/u with Psychiatry at [facility]    Return for 1 year physical.  Patient was given opportunity to ask questions. Patient verbalized understanding of the plan and was able to repeat key elements of the plan. All questions were answered to their satisfaction.   Arnette Felts, FNP  I, Arnette Felts, FNP, have reviewed all documentation for this visit. The documentation on 11/09/22 for the exam, diagnosis, procedures, and orders are all accurate and complete.

## 2022-11-09 NOTE — Assessment & Plan Note (Signed)
Continue f/u with Pyschiatrist at [facility]

## 2022-11-09 NOTE — Patient Instructions (Signed)

## 2022-11-09 NOTE — Assessment & Plan Note (Signed)
Behavior modifications discussed and diet history reviewed.   Pt will continue to exercise regularly and modify diet with low GI, plant based foods and decrease intake of processed foods.  Recommend intake of daily multivitamin, Vitamin D, and calcium.  Recommend mammogram since her mother was diagnosed with breast cancer at age 29 for preventive screenings, as well as recommend immunizations that include influenza, TDAP

## 2022-11-09 NOTE — Assessment & Plan Note (Signed)
Continue f/u with Psychiatry at [facility]

## 2022-11-16 NOTE — Addendum Note (Signed)
Addended by: Arnette Felts F on: 11/16/2022 04:55 PM   Modules accepted: Orders

## 2022-11-21 ENCOUNTER — Ambulatory Visit

## 2022-12-31 ENCOUNTER — Other Ambulatory Visit

## 2023-02-08 ENCOUNTER — Encounter: Payer: Self-pay | Admitting: Nurse Practitioner

## 2023-02-11 ENCOUNTER — Ambulatory Visit
Admission: RE | Admit: 2023-02-11 | Discharge: 2023-02-11 | Disposition: A | Discharge status: Home / Self Care | Source: Ambulatory Visit | Attending: Nurse Practitioner | Admitting: Nurse Practitioner

## 2023-02-11 DIAGNOSIS — R928 Other abnormal and inconclusive findings on diagnostic imaging of breast: Secondary | ICD-10-CM | POA: Diagnosis not present

## 2023-02-11 DIAGNOSIS — Z803 Family history of malignant neoplasm of breast: Secondary | ICD-10-CM

## 2023-02-11 MED ORDER — GADOPICLENOL 0.5 MMOL/ML IV SOLN
7.5000 mL | Freq: Once | INTRAVENOUS | Status: AC | PRN
  Administered 2023-02-11: 7.5 mL via INTRAVENOUS

## 2023-04-22 ENCOUNTER — Ambulatory Visit (HOSPITAL_COMMUNITY)

## 2023-08-24 ENCOUNTER — Other Ambulatory Visit: Payer: Self-pay | Admitting: Nurse Practitioner

## 2023-08-24 DIAGNOSIS — Z803 Family history of malignant neoplasm of breast: Secondary | ICD-10-CM

## 2023-09-26 ENCOUNTER — Encounter: Payer: Self-pay | Admitting: Nurse Practitioner

## 2023-09-30 ENCOUNTER — Other Ambulatory Visit

## 2023-10-04 ENCOUNTER — Encounter: Payer: Self-pay | Admitting: Nurse Practitioner

## 2023-10-08 ENCOUNTER — Other Ambulatory Visit: Payer: Self-pay

## 2023-10-08 ENCOUNTER — Inpatient Hospital Stay (HOSPITAL_COMMUNITY)

## 2023-10-08 ENCOUNTER — Inpatient Hospital Stay (HOSPITAL_COMMUNITY)
Admission: AD | Admit: 2023-10-08 | Discharge: 2023-10-08 | Disposition: A | Discharge status: Home / Self Care | Attending: Family Medicine | Admitting: Family Medicine

## 2023-10-08 ENCOUNTER — Encounter (HOSPITAL_COMMUNITY): Payer: Self-pay

## 2023-10-08 DIAGNOSIS — Z3A00 Weeks of gestation of pregnancy not specified: Secondary | ICD-10-CM | POA: Diagnosis not present

## 2023-10-08 DIAGNOSIS — O23591 Infection of other part of genital tract in pregnancy, first trimester: Secondary | ICD-10-CM | POA: Insufficient documentation

## 2023-10-08 DIAGNOSIS — B9689 Other specified bacterial agents as the cause of diseases classified elsewhere: Secondary | ICD-10-CM | POA: Diagnosis not present

## 2023-10-08 DIAGNOSIS — Z113 Encounter for screening for infections with a predominantly sexual mode of transmission: Secondary | ICD-10-CM | POA: Diagnosis present

## 2023-10-08 DIAGNOSIS — N760 Acute vaginitis: Secondary | ICD-10-CM

## 2023-10-08 DIAGNOSIS — O3680X0 Pregnancy with inconclusive fetal viability, not applicable or unspecified: Secondary | ICD-10-CM | POA: Diagnosis not present

## 2023-10-08 DIAGNOSIS — R109 Unspecified abdominal pain: Secondary | ICD-10-CM | POA: Diagnosis present

## 2023-10-08 DIAGNOSIS — O209 Hemorrhage in early pregnancy, unspecified: Secondary | ICD-10-CM | POA: Diagnosis present

## 2023-10-08 DIAGNOSIS — R7989 Other specified abnormal findings of blood chemistry: Secondary | ICD-10-CM | POA: Diagnosis not present

## 2023-10-08 LAB — WET PREP, GENITAL
Sperm: NONE SEEN
Trich, Wet Prep: NONE SEEN
WBC, Wet Prep HPF POC: >=10 — AB (ref <10)
Yeast Wet Prep HPF POC: NONE SEEN

## 2023-10-08 LAB — CBC
HCT: 39.2 % (ref 36.0–46.0)
Hemoglobin: 13.1 g/dL (ref 12.0–15.0)
MCH: 27.4 pg (ref 26.0–34.0)
MCHC: 33.4 g/dL (ref 30.0–36.0)
MCV: 82 fL (ref 80.0–100.0)
Platelets: 319 10*3/uL (ref 150–400)
RBC: 4.78 MIL/uL (ref 3.87–5.11)
RDW: 12.1 % (ref 11.5–15.5)
WBC: 6.5 10*3/uL (ref 4.0–10.5)
nRBC: 0 % (ref 0.0–0.2)

## 2023-10-08 LAB — URINALYSIS, ROUTINE W REFLEX MICROSCOPIC
Bacteria, UA: NONE SEEN
Bilirubin Urine: NEGATIVE
Glucose, UA: NEGATIVE mg/dL
Ketones, ur: NEGATIVE mg/dL
Leukocytes,Ua: NEGATIVE
Nitrite: NEGATIVE
Protein, ur: NEGATIVE mg/dL
Specific Gravity, Urine: 1.02 (ref 1.005–1.030)
pH: 6 (ref 5.0–8.0)

## 2023-10-08 LAB — HCG, QUANTITATIVE, PREGNANCY: hCG, Beta Chain, Quant, S: 92 m[IU]/mL — ABNORMAL HIGH (ref <5)

## 2023-10-08 LAB — POCT PREGNANCY, URINE: Preg Test, Ur: POSITIVE — AB

## 2023-10-08 MED ORDER — METRONIDAZOLE 500 MG PO TABS: 500.0000 mg | ORAL_TABLET | Freq: Two times a day (BID) | ORAL | 0 refills | Status: DC

## 2023-10-08 NOTE — MAU Note (Signed)
.  Yvonne Christensen is a 28 y.o. at Unknown here in MAU reporting: Pregnancy test + 5/13,5/14, and 5/19. Patient reports today she is having a dark red period.  Abdominal cramping 4/10   Pain score: 4/10 There were no vitals filed for this visit.    Lab orders placed from triage:   ua poct preg

## 2023-10-08 NOTE — MAU Provider Note (Signed)
 S Ms. Yvonne Christensen is a 28 y.o. G3P0010 patient who presents to MAU today with complaint of vaginal bleeding with a positive HPT. Denies heavy VB or clots and offers no other complaints at this time.    O BP 107/69 (BP Location: Right Arm)   Temp 98.2 F (36.8 C)   Resp 16   LMP 09/11/2023   SpO2 100%  Physical Exam Vitals and nursing note reviewed.  Constitutional:      General: She is not in acute distress.    Appearance: She is well-developed. She is not ill-appearing.  HENT:     Head: Normocephalic.     Mouth/Throat:     Mouth: Mucous membranes are moist.  Cardiovascular:     Rate and Rhythm: Normal rate.  Pulmonary:     Effort: Pulmonary effort is normal.  Abdominal:     Palpations: Abdomen is soft.  Skin:    General: Skin is warm.  Neurological:     Mental Status: She is alert and oriented to person, place, and time.  Psychiatric:        Mood and Affect: Mood normal.        Behavior: Behavior normal.    MDM  HIGH   Vaginal bleeding in early pregnacy CBC: unremarkable HCG Quant: 92 ( Will need repeat HCG  in 48 hours)  ABO:  O Positive OB Ultrasound ( Unable to visualize IUP at this time d/t likely early gestation- will need to follow HCG Quants  )  Swabs: C/W  BV - Will discharge home on Flagyl   UA: Unremarkable   Differential diagnosis considered for 1st trimester vaginal bleeding includes but is not limited to: ectopic pregnancy, complete spontaneous abortion, incomplete abortion, missed abortion, threatened abortion, embryonic/fetal demise, cervical insufficiency, cervical or vaginal disorder    Orders Placed This Encounter  Procedures   Wet prep, genital    Standing Status:   Standing    Number of Occurrences:   1   US  OB LESS THAN 14 WEEKS WITH OB TRANSVAGINAL    Standing Status:   Standing    Number of Occurrences:   1    Symptom/Reason for Exam:   Vaginal bleeding [[id number]]   Urinalysis, Routine w reflex microscopic -Urine, Clean Catch     Standing Status:   Standing    Number of Occurrences:   1    Specimen Source:   Urine, Clean Catch [76]   CBC    Standing Status:   Standing    Number of Occurrences:   1   hCG, quantitative, pregnancy    Standing Status:   Standing    Number of Occurrences:   1   Pregnancy, urine POC    Standing Status:   Standing    Number of Occurrences:   1   Discharge patient Discharge disposition: 01-Home or Self Care; Discharge patient date: 10/08/2023    Standing Status:   Standing    Number of Occurrences:   1    Discharge disposition:   01-Home or Self Care [1]    Discharge patient date:   10/08/2023      Results for orders placed or performed during the hospital encounter of 10/08/23 (from the past 24 hours)  Urinalysis, Routine w reflex microscopic -Urine, Clean Catch     Status: Abnormal   Collection Time: 10/08/23  5:36 PM  Result Value Ref Range   Color, Urine YELLOW YELLOW   APPearance HAZY (A) CLEAR   Specific  Gravity, Urine 1.020 1.005 - 1.030   pH 6.0 5.0 - 8.0   Glucose, UA NEGATIVE NEGATIVE mg/dL   Hgb urine dipstick SMALL (A) NEGATIVE   Bilirubin Urine NEGATIVE NEGATIVE   Ketones, ur NEGATIVE NEGATIVE mg/dL   Protein, ur NEGATIVE NEGATIVE mg/dL   Nitrite NEGATIVE NEGATIVE   Leukocytes,Ua NEGATIVE NEGATIVE   RBC / HPF 0-5 0 - 5 RBC/hpf   WBC, UA 0-5 0 - 5 WBC/hpf   Bacteria, UA NONE SEEN NONE SEEN   Squamous Epithelial / HPF 0-5 0 - 5 /HPF   Mucus PRESENT   Pregnancy, urine POC     Status: Abnormal   Collection Time: 10/08/23  5:40 PM  Result Value Ref Range   Preg Test, Ur POSITIVE (A) NEGATIVE  Wet prep, genital     Status: Abnormal   Collection Time: 10/08/23  6:55 PM  Result Value Ref Range   Yeast Wet Prep HPF POC NONE SEEN NONE SEEN   Trich, Wet Prep NONE SEEN NONE SEEN   Clue Cells Wet Prep HPF POC PRESENT (A) NONE SEEN   WBC, Wet Prep HPF POC >=10 (A) <10   Sperm NONE SEEN   CBC     Status: None   Collection Time: 10/08/23  7:36 PM  Result Value Ref  Range   WBC 6.5 4.0 - 10.5 K/uL   RBC 4.78 3.87 - 5.11 MIL/uL   Hemoglobin 13.1 12.0 - 15.0 g/dL   HCT 16.1 09.6 - 04.5 %   MCV 82.0 80.0 - 100.0 fL   MCH 27.4 26.0 - 34.0 pg   MCHC 33.4 30.0 - 36.0 g/dL   RDW 40.9 81.1 - 91.4 %   Platelets 319 150 - 400 K/uL   nRBC 0.0 0.0 - 0.2 %  hCG, quantitative, pregnancy     Status: Abnormal   Collection Time: 10/08/23  7:36 PM  Result Value Ref Range   hCG, Beta Chain, Quant, S 92 (H) <5 mIU/mL      I have reviewed the patient chart and performed the physical exam. A/P as described below.  Counseling and education provided and patient agreeable  with plan as described below. Verbalized understanding.    ASSESSMENT Medical screening exam complete 1. Bacterial vaginosis (Primary)  2. Elevated serum hCG  3. Pregnancy of unknown anatomic location    PLAN Future Appointments  Date Time Provider Department Center  10/10/2023 10:30 AM [facility]  11/13/2023  9:20 AM Susanna Epley, FNP [facility] None    Discharge from MAU in stable condition  Follow up for repeat HCG in 48 hours as scheduled above  See AVS for full description of educational information and instructions provided to the patient at time of discharge  List of options for follow-up given   Warning signs for worsening condition that would warrant emergency follow-up discussed Patient may return to MAU as needed   Cherlynn Cornfield, NP 10/08/2023 9:03 PM

## 2023-10-10 ENCOUNTER — Other Ambulatory Visit: Payer: Self-pay

## 2023-10-10 LAB — GC/CHLAMYDIA PROBE AMP (~~LOC~~) NOT AT ARMC
Chlamydia: NEGATIVE
Comment: NEGATIVE
Comment: NORMAL
Neisseria Gonorrhea: NEGATIVE

## 2023-10-11 ENCOUNTER — Other Ambulatory Visit: Payer: Self-pay

## 2023-10-11 ENCOUNTER — Other Ambulatory Visit

## 2023-10-11 DIAGNOSIS — O039 Complete or unspecified spontaneous abortion without complication: Secondary | ICD-10-CM

## 2023-10-11 LAB — BETA HCG QUANT (REF LAB): hCG Quant: 25 m[IU]/mL

## 2023-10-11 NOTE — Addendum Note (Signed)
 Addended by: Pati Bonine on: 10/11/2023 08:13 AM   Modules accepted: Orders

## 2023-10-12 ENCOUNTER — Ambulatory Visit: Payer: Self-pay | Admitting: Family Medicine

## 2023-10-12 NOTE — Telephone Encounter (Signed)
 Given the news of likely miscarriage.

## 2023-10-12 NOTE — Telephone Encounter (Signed)
-----   Message from Granville Layer sent at 10/12/2023  8:29 AM EDT ----- Her HCG appears to be falling consistent with a miscarriage. Please trend HCGs weekly until < 5.

## 2023-10-12 NOTE — Telephone Encounter (Signed)
 Called and spoke with patient. She is aware of experiencing a miscarriage. Reviewed will need to follow Hcg levels until < 5. Scheduled follow up non stat HCG for next week at patients convenience.

## 2023-10-17 ENCOUNTER — Other Ambulatory Visit: Payer: Self-pay

## 2023-10-17 DIAGNOSIS — R7989 Other specified abnormal findings of blood chemistry: Secondary | ICD-10-CM

## 2023-10-19 ENCOUNTER — Other Ambulatory Visit

## 2023-10-19 ENCOUNTER — Other Ambulatory Visit: Payer: Self-pay

## 2023-10-19 DIAGNOSIS — R7989 Other specified abnormal findings of blood chemistry: Secondary | ICD-10-CM

## 2023-10-23 LAB — BETA HCG QUANT (REF LAB): hCG Quant: 1 m[IU]/mL

## 2023-11-13 ENCOUNTER — Ambulatory Visit (INDEPENDENT_AMBULATORY_CARE_PROVIDER_SITE_OTHER): Admitting: Nurse Practitioner

## 2023-11-13 ENCOUNTER — Encounter: Payer: Self-pay | Admitting: Nurse Practitioner

## 2023-11-13 VITALS — BP 100/70 | HR 96 | Temp 98.5°F | Ht 66.0 in | Wt 164.2 lb

## 2023-11-13 DIAGNOSIS — Z8759 Personal history of other complications of pregnancy, childbirth and the puerperium: Secondary | ICD-10-CM | POA: Insufficient documentation

## 2023-11-13 DIAGNOSIS — F3160 Bipolar disorder, current episode mixed, unspecified: Secondary | ICD-10-CM

## 2023-11-13 DIAGNOSIS — Z0000 Encounter for general adult medical examination without abnormal findings: Secondary | ICD-10-CM | POA: Insufficient documentation

## 2023-11-13 DIAGNOSIS — Z6826 Body mass index (BMI) 26.0-26.9, adult: Secondary | ICD-10-CM

## 2023-11-13 DIAGNOSIS — Z79899 Other long term (current) drug therapy: Secondary | ICD-10-CM

## 2023-11-13 DIAGNOSIS — F419 Anxiety disorder, unspecified: Secondary | ICD-10-CM

## 2023-11-13 DIAGNOSIS — E559 Vitamin D deficiency, unspecified: Secondary | ICD-10-CM

## 2023-11-13 DIAGNOSIS — J029 Acute pharyngitis, unspecified: Secondary | ICD-10-CM | POA: Insufficient documentation

## 2023-11-13 DIAGNOSIS — M3501 Sicca syndrome with keratoconjunctivitis: Secondary | ICD-10-CM

## 2023-11-13 DIAGNOSIS — Z01419 Encounter for gynecological examination (general) (routine) without abnormal findings: Secondary | ICD-10-CM

## 2023-11-13 DIAGNOSIS — L930 Discoid lupus erythematosus: Secondary | ICD-10-CM

## 2023-11-13 DIAGNOSIS — E663 Overweight: Secondary | ICD-10-CM

## 2023-11-13 DIAGNOSIS — Z2821 Immunization not carried out because of patient refusal: Secondary | ICD-10-CM

## 2023-11-13 LAB — POCT RAPID STREP A (OFFICE): Rapid Strep A Screen: NEGATIVE

## 2023-11-13 LAB — POC COVID19 BINAXNOW: SARS Coronavirus 2 Ag: NEGATIVE

## 2023-11-13 NOTE — Assessment & Plan Note (Signed)
Continue f/u with Pyschiatrist at [facility]

## 2023-11-13 NOTE — Progress Notes (Signed)
 LILLETTE Kristeen JINNY Gladis, CMA,acting as a Neurosurgeon for Yvonne Ada, FNP.,have documented all relevant documentation on the behalf of Yvonne Ada, FNP,as directed by  Yvonne Ada, FNP while in the presence of Yvonne Ada, FNP.  Subjective:    Patient ID: Yvonne Christensen , female    DOB: [birth year] , 28 y.o.   MRN: [id number]  Chief Complaint  Patient presents with   Annual Exam    Patient presents today for HM, Patient reports compliance with medication. Patient denies any chest pain, SOB, or headaches. Patient reports Thursday she got really hot and has had a sore throat.    Miscarriage    Patient reports she had a miscarriage May 25th. She reports having brown blood last Thursday.     HPI  Here for HM. She goes to the [facility] for her [facility]. She had brown discharge and cramping on June 25th. Her miscarriage was in May 25th. She went to ER and sent to [facility] to recheck her HCG count.  She had a PAP done at the [facility] this year.      Past Medical History:  Diagnosis Date   Allergy    Bacterial vaginosis 03/2016   being treated, dosnot remember med name   Decreased fetal movement in pregnancy, antepartum 04/23/2016   Depression    Leakage of amniotic fluid 03/25/2016   Lupus    Sjogren's syndrome (HCC)      Family History  Problem Relation Age of Onset   Breast cancer Mother    Bipolar disorder Mother    Cirrhosis Father    ADD / ADHD Sister    Hypertension Maternal Grandmother    Prostate cancer Maternal Grandfather    Bipolar disorder Paternal Grandmother      Current Outpatient Medications:    ARIPiprazole  (ABILIFY ) 10 MG tablet, Take 1 tablet (10 mg total) by mouth daily., Disp: 90 tablet, Rfl: 0   azaTHIOprine (IMURAN) 50 MG tablet, Take 100 mg by mouth daily as needed., Disp: , Rfl:    escitalopram  (LEXAPRO ) 20 MG tablet, Take 1 tablet (20 mg total) by mouth daily., Disp: 90 tablet, Rfl: 0   hydroxychloroquine (PLAQUENIL) 200 MG tablet, Take 200 mg by  mouth daily., Disp: , Rfl:    metroNIDAZOLE  (FLAGYL ) 500 MG tablet, Take 1 tablet (500 mg total) by mouth 2 (two) times daily., Disp: 14 tablet, Rfl: 0   traZODone  (DESYREL ) 100 MG tablet, Take 1 tablet (100 mg total) by mouth at bedtime., Disp: 90 tablet, Rfl: 0   triamcinolone  cream (KENALOG ) 0.1 %, Apply 1 application topically 2 (two) times daily., Disp: 45 g, Rfl: 3   Vitamin D, Ergocalciferol, (DRISDOL) 1.25 MG (50000 UNIT) CAPS capsule, Take 50,000 Units by mouth once a week., Disp: , Rfl:    Allergies  Allergen Reactions   Penicillins Rash   Latex       The patient states she uses none for birth control. Patient's last menstrual period was 09/11/2023.. Negative for Dysmenorrhea and Negative for Menorrhagia. Negative for: breast discharge, breast lump(s), breast pain and breast self exam. Associated symptoms include abnormal vaginal bleeding. Pertinent negatives include abnormal bleeding (hematology), anxiety, decreased libido, depression, difficulty falling sleep, dyspareunia, history of infertility, nocturia, sexual dysfunction, sleep disturbances, urinary incontinence, urinary urgency, vaginal discharge and vaginal itching. Diet regular.  The patient states her exercise level is every other day.   The patient's tobacco use is:  Social History   Tobacco Use  Smoking Status Never  Smokeless Tobacco Never  . She has been exposed to passive smoke. The patient's alcohol use is:  Social History   Substance and Sexual Activity  Alcohol Use Not Currently  Additional information: Last pap this year per patient at the [facility], next one scheduled for 3 years - will refer to GYN.    Review of Systems  Constitutional: Negative.   HENT:  Positive for sore throat.   Eyes: Negative.   Respiratory: Negative.    Cardiovascular: Negative.   Gastrointestinal: Negative.   Endocrine: Negative.   Genitourinary: Negative.   Musculoskeletal: Negative.   Allergic/Immunologic: Negative.    Neurological: Negative.   Hematological: Negative.   Psychiatric/Behavioral: Negative.       Today's Vitals   11/13/23 0942  BP: 100/70  Pulse: 96  Temp: 98.5 F (36.9 C)  TempSrc: Oral  Weight: 164 lb 3.2 oz (74.5 kg)  Height: 5' 6 (1.676 m)  PainSc: 10-Worst pain ever  PainLoc: Back   Body mass index is 26.5 kg/m.  Wt Readings from Last 3 Encounters:  11/13/23 164 lb 3.2 oz (74.5 kg)  11/09/22 159 lb 12.8 oz (72.5 kg)  08/11/22 159 lb 4.8 oz (72.3 kg)     Objective:  Physical Exam Vitals and nursing note reviewed.  Constitutional:      General: She is not in acute distress.    Appearance: Normal appearance. She is well-developed. She is obese.  HENT:     Head: Normocephalic and atraumatic.     Right Ear: Hearing, tympanic membrane, ear canal and external ear normal. There is no impacted cerumen.     Left Ear: Hearing, tympanic membrane, ear canal and external ear normal. There is no impacted cerumen.     Nose: Nose normal.     Mouth/Throat:     Mouth: Mucous membranes are moist.   Eyes:     General: Lids are normal.     Extraocular Movements: Extraocular movements intact.     Conjunctiva/sclera: Conjunctivae normal.     Pupils: Pupils are equal, round, and reactive to light.     Funduscopic exam:    Right eye: No papilledema.        Left eye: No papilledema.   Neck:     Thyroid: No thyroid mass.     Vascular: No carotid bruit.   Cardiovascular:     Rate and Rhythm: Normal rate and regular rhythm.     Pulses: Normal pulses.     Heart sounds: Normal heart sounds. No murmur heard. Pulmonary:     Effort: Pulmonary effort is normal. No respiratory distress.     Breath sounds: Normal breath sounds. No wheezing.  Chest:     Chest wall: No mass.  Breasts:    Tanner Score is 5.     Right: Normal. No mass or tenderness.     Left: Normal. No mass or tenderness.  Abdominal:     General: Abdomen is flat. Bowel sounds are normal. There is no distension.      Palpations: Abdomen is soft.     Tenderness: There is no abdominal tenderness.  Genitourinary:    Comments: Deferred - PAP done at [facility] - referral to GYN placed.   Musculoskeletal:        General: No swelling. Normal range of motion.     Cervical back: Full passive range of motion without pain, normal range of motion and neck supple.     Right lower leg: No edema.     Left  lower leg: No edema.  Lymphadenopathy:     Upper Body:     Right upper body: No supraclavicular, axillary or pectoral adenopathy.     Left upper body: No supraclavicular, axillary or pectoral adenopathy.   Skin:    General: Skin is warm and dry.     Capillary Refill: Capillary refill takes less than 2 seconds.   Neurological:     General: No focal deficit present.     Mental Status: She is alert and oriented to person, place, and time.     Cranial Nerves: No cranial nerve deficit.     Sensory: No sensory deficit.     Motor: No weakness.   Psychiatric:        Mood and Affect: Mood normal.        Behavior: Behavior normal.        Thought Content: Thought content normal.        Judgment: Judgment normal.      Assessment And Plan:     Encounter for annual health examination Assessment & Plan: Behavior modifications discussed and diet history reviewed.   Pt will continue to exercise regularly and modify diet with low GI, plant based foods and decrease intake of processed foods.  Recommend intake of daily multivitamin, Vitamin D, and calcium.  Recommend monthly self breast exams for preventive screenings, as well as recommend immunizations that include influenza, TDAP    Vitamin D deficiency Assessment & Plan: Will check vitamin D level and supplement as needed.    Also encouraged to spend 15 minutes in the sun daily.    Orders: -     VITAMIN D 25 Hydroxy (Vit-D Deficiency, Fractures)  Mixed bipolar I disorder (HCC) Assessment & Plan: Continue f/u with Pyschiatrist at [facility]  Orders: -      CMP14+EGFR  Sjogren syndrome with keratoconjunctivitis (HCC) Assessment & Plan: Continue f/u with Rheumatology at [facility]  Orders: -     CBC with Differential/Platelet  Discoid lupus erythematosus Assessment & Plan: Continue f/u with Dermatology/R[facility]umatology at [facility]  Orders: -     CBC with Differential/Platelet  Anxiety Assessment & Plan: Continue f/u with Psychiatry at [facility]   Sore throat -     POC COVID-19 BinaxNow -     POCT rapid strep A  COVID-19 vaccination declined Assessment & Plan: Declines covid 19 vaccine. Discussed risk of covid 35 and if she changes her mind about the vaccine to call the office. Education has been provided regarding the importance of this vaccine but patient still declined. Advised may receive this vaccine at local pharmacy or Health Dept.or vaccine clinic. Encouraged to take multivitamin, vitamin d, vitamin c and zinc to increase immune system. Aware can call office if would like to have vaccine here at office. Verbalized acceptance and understanding.    Overweight with body mass index (BMI) of 26 to 26.9 in adult  Other long term (current) drug therapy -     CBC with Differential/Platelet  Encounter for gynecological examination -     Ambulatory referral to Obstetrics / Gynecology  History of miscarriage    Return for 1 year physical,. Patient was given opportunity to ask questions. Patient verbalized understanding of the plan and was able to repeat key elements of the plan. All questions were answered to their satisfaction.   Yvonne Ada, FNP  I, Yvonne Ada, FNP, have reviewed all documentation for this visit. The documentation on 11/13/23 for the exam, diagnosis, procedures, and orders are all accurate and complete.

## 2023-11-13 NOTE — Assessment & Plan Note (Signed)
Continue f/u with Dermatology/Rheumatology at [facility]

## 2023-11-13 NOTE — Assessment & Plan Note (Signed)
 Continue f/u with Rheumatology at [facility]

## 2023-11-13 NOTE — Assessment & Plan Note (Signed)
 Will check vitamin D level and supplement as needed.    Also encouraged to spend 15 minutes in the sun daily.

## 2023-11-13 NOTE — Assessment & Plan Note (Signed)
Declines covid 19 vaccine. Discussed risk of covid 57 and if she changes her mind about the vaccine to call the office. Education has been provided regarding the importance of this vaccine but patient still declined. Advised may receive this vaccine at local pharmacy or Health Dept.or vaccine clinic. Encouraged to take multivitamin, vitamin d, vitamin c and zinc to increase immune system. Aware can call office if would like to have vaccine here at office. Verbalized acceptance and understanding.

## 2023-11-13 NOTE — Assessment & Plan Note (Signed)
 Had miscarriage May 25th, had a dark brown menstrual cycle June 24

## 2023-11-13 NOTE — Assessment & Plan Note (Signed)
Continue f/u with Psychiatry at [facility]

## 2023-11-13 NOTE — Assessment & Plan Note (Signed)
 Behavior modifications discussed and diet history reviewed.   Pt will continue to exercise regularly and modify diet with low GI, plant based foods and decrease intake of processed foods.  Recommend intake of daily multivitamin, Vitamin D, and calcium.  Recommend monthly self breast exams for preventive screenings, as well as recommend immunizations that include influenza, TDAP

## 2023-11-14 ENCOUNTER — Other Ambulatory Visit: Payer: Self-pay | Admitting: Nurse Practitioner

## 2023-11-14 DIAGNOSIS — J029 Acute pharyngitis, unspecified: Secondary | ICD-10-CM

## 2023-11-14 LAB — CMP14+EGFR
ALT: 19 IU/L (ref 0–32)
AST: 17 IU/L (ref 0–40)
Albumin: 4.7 g/dL (ref 4.0–5.0)
Alkaline Phosphatase: 67 IU/L (ref 44–121)
BUN/Creatinine Ratio: 10 (ref 9–23)
BUN: 9 mg/dL (ref 6–20)
Bilirubin Total: 0.4 mg/dL (ref 0.0–1.2)
CO2: 19 mmol/L — ABNORMAL LOW (ref 20–29)
Calcium: 9.6 mg/dL (ref 8.7–10.2)
Chloride: 103 mmol/L (ref 96–106)
Creatinine, Ser: 0.92 mg/dL (ref 0.57–1.00)
Globulin, Total: 3.4 g/dL (ref 1.5–4.5)
Glucose: 79 mg/dL (ref 70–99)
Potassium: 4.1 mmol/L (ref 3.5–5.2)
Sodium: 139 mmol/L (ref 134–144)
Total Protein: 8.1 g/dL (ref 6.0–8.5)
eGFR: 86 mL/min/{1.73_m2} (ref >59)

## 2023-11-14 LAB — CBC WITH DIFFERENTIAL/PLATELET
Basophils Absolute: 0 10*3/uL (ref 0.0–0.2)
Basos: 1 %
EOS (ABSOLUTE): 0 10*3/uL (ref 0.0–0.4)
Eos: 1 %
Hematocrit: 43.5 % (ref 34.0–46.6)
Hemoglobin: 14.1 g/dL (ref 11.1–15.9)
Immature Grans (Abs): 0 10*3/uL (ref 0.0–0.1)
Immature Granulocytes: 0 %
Lymphocytes Absolute: 1.2 10*3/uL (ref 0.7–3.1)
Lymphs: 21 %
MCH: 27.9 pg (ref 26.6–33.0)
MCHC: 32.4 g/dL (ref 31.5–35.7)
MCV: 86 fL (ref 79–97)
Monocytes Absolute: 0.3 10*3/uL (ref 0.1–0.9)
Monocytes: 6 %
Neutrophils Absolute: 4.2 10*3/uL (ref 1.4–7.0)
Neutrophils: 71 %
Platelets: 335 10*3/uL (ref 150–450)
RBC: 5.05 x10E6/uL (ref 3.77–5.28)
RDW: 12.4 % (ref 11.7–15.4)
WBC: 5.8 10*3/uL (ref 3.4–10.8)

## 2023-11-14 LAB — VITAMIN D 25 HYDROXY (VIT D DEFICIENCY, FRACTURES): Vit D, 25-Hydroxy: 33.6 ng/mL (ref 30.0–100.0)

## 2023-11-14 MED ORDER — AZITHROMYCIN 250 MG PO TABS: ORAL_TABLET | ORAL | 0 refills | Status: AC

## 2023-11-15 ENCOUNTER — Inpatient Hospital Stay
Admission: RE | Admit: 2023-11-15 | Discharge: 2023-11-15 | Discharge status: Home / Self Care | Source: Ambulatory Visit | Attending: Nurse Practitioner | Admitting: Nurse Practitioner

## 2023-11-15 DIAGNOSIS — Z803 Family history of malignant neoplasm of breast: Secondary | ICD-10-CM

## 2023-11-15 MED ORDER — GADOPICLENOL 0.5 MMOL/ML IV SOLN
7.0000 mL | Freq: Once | INTRAVENOUS | Status: AC | PRN
  Administered 2023-11-15: 7 mL via INTRAVENOUS

## 2023-11-16 ENCOUNTER — Ambulatory Visit: Payer: Self-pay | Admitting: Nurse Practitioner

## 2023-12-02 ENCOUNTER — Telehealth: Admitting: Family Medicine

## 2023-12-02 DIAGNOSIS — N760 Acute vaginitis: Secondary | ICD-10-CM | POA: Diagnosis not present

## 2023-12-02 MED ORDER — FLUCONAZOLE 150 MG PO TABS: 150.0000 mg | ORAL_TABLET | Freq: Once | ORAL | 0 refills | Status: AC

## 2023-12-02 NOTE — Progress Notes (Signed)
 Virtual Visit Consent   Yvonne Christensen, you are scheduled for a virtual visit with a [facility] provider today. Just as with appointments in the office, your consent must be obtained to participate. Your consent will be active for this visit and any virtual visit you may have with one of our providers in the next 365 days. If you have a MyChart account, a copy of this consent can be sent to you electronically.  As this is a virtual visit, video technology does not allow for your provider to perform a traditional examination. This may limit your provider's ability to fully assess your condition. If your provider identifies any concerns that need to be evaluated in person or the need to arrange testing (such as labs, EKG, etc.), we will make arrangements to do so. Although advances in technology are sophisticated, we cannot ensure that it will always work on either your end or our end. If the connection with a video visit is poor, the visit may have to be switched to a telephone visit. With either a video or telephone visit, we are not always able to ensure that we have a secure connection.  By engaging in this virtual visit, you consent to the provision of healthcare and authorize for your insurance to be billed (if applicable) for the services provided during this visit. Depending on your insurance coverage, you may receive a charge related to this service.  I need to obtain your verbal consent now. Are you willing to proceed with your visit today? [name] has provided verbal consent on 12/02/2023 for a virtual visit (video or telephone). Yvonne Christensen, [place]  Date: 12/02/2023 10:16 AM   Virtual Visit via Video Note   I, Yvonne Christensen, connected with  ALDEAN SUDDETH  ([id number], Dec 10, 1995) on 12/02/23 at 10:15 AM EDT by a video-enabled telemedicine application and verified that I am speaking with the correct person using two identifiers.  Location: Patient: Virtual Visit Location Patient:  Home Provider: Virtual Visit Location Provider: Home Office   I discussed the limitations of evaluation and management by telemedicine and the availability of in person appointments. The patient expressed understanding and agreed to proceed.    History of Present Illness: Yvonne Christensen is a 28 y.o. who identifies as a female who was assigned female at birth, and is being seen today for c/o a week or so ago she took antibiotics for strep throat and now has a yeast infection.  Pt states symptoms started last week but just as a small irritation and itch but now is really bad.  Pt states she is having vaginal discharge, itchiness and irritation. Pt does not report other symptoms or concerns today.   HPI: HPI  Problems:  Patient Active Problem List   Diagnosis Date Noted   Encounter for annual health examination 11/13/2023   Sore throat 11/13/2023   History of miscarriage 11/13/2023   Discoid lupus erythematosus 11/09/2022   Vitamin D  deficiency 11/09/2022   Routine general medical examination at health care facility 11/09/2022   COVID-19 vaccination declined 11/09/2022   Family history of breast cancer in mother 11/09/2022   Mixed bipolar I disorder (HCC) 08/10/2022   Pain in joint, pelvic region and thigh 08/10/2022   Patellofemoral syndrome 08/10/2022   Sjogren syndrome with keratoconjunctivitis (HCC) 11/04/2021   Anxiety 06/01/2021   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   Chest pain, unspecified 06/23/2017   History of chlamydia 01/01/2016  Allergies:  Allergies  Allergen Reactions   Penicillins Rash   Latex    Medications:  Current Outpatient Medications:    fluconazole  (DIFLUCAN ) 150 MG tablet, Take 1 tablet (150 mg total) by mouth once for 1 dose., Disp: 1 tablet, Rfl: 0   ARIPiprazole  (ABILIFY ) 10 MG tablet, Take 1 tablet (10 mg total) by mouth daily., Disp: 90 tablet, Rfl: 0   azaTHIOprine (IMURAN) 50 MG  tablet, Take 100 mg by mouth daily as needed., Disp: , Rfl:    escitalopram  (LEXAPRO ) 20 MG tablet, Take 1 tablet (20 mg total) by mouth daily., Disp: 90 tablet, Rfl: 0   hydroxychloroquine (PLAQUENIL) 200 MG tablet, Take 200 mg by mouth daily., Disp: , Rfl:    metroNIDAZOLE  (FLAGYL ) 500 MG tablet, Take 1 tablet (500 mg total) by mouth 2 (two) times daily., Disp: 14 tablet, Rfl: 0   traZODone  (DESYREL ) 100 MG tablet, Take 1 tablet (100 mg total) by mouth at bedtime., Disp: 90 tablet, Rfl: 0   triamcinolone  cream (KENALOG ) 0.1 %, Apply 1 application topically 2 (two) times daily., Disp: 45 g, Rfl: 3   Vitamin D , Ergocalciferol , (DRISDOL) 1.25 MG (50000 UNIT) CAPS capsule, Take 50,000 Units by mouth once a week., Disp: , Rfl:   Observations/Objective: Patient is well-developed, well-nourished in no acute distress.  Resting comfortably at home.  Head is normocephalic, atraumatic.  No labored breathing.  Speech is clear and coherent with logical content.  Patient is alert and oriented at baseline.    Assessment and Plan: 1. Vaginosis (Primary) - fluconazole  (DIFLUCAN ) 150 MG tablet; Take 1 tablet (150 mg total) by mouth once for 1 dose.  Dispense: 1 tablet; Refill: 0  -Start Fluconazole   -Pt to F/U with PCP or urgent care for worsening symptoms.   Follow Up Instructions: I discussed the assessment and treatment plan with the patient. The patient was provided an opportunity to ask questions and all were answered. The patient agreed with the plan and demonstrated an understanding of the instructions.  A copy of instructions were sent to the patient via MyChart unless otherwise noted below.     The patient was advised to call back or seek an in-person evaluation if the symptoms worsen or if the condition fails to improve as anticipated.    Yvonne Mater, PA-C

## 2023-12-02 NOTE — Patient Instructions (Signed)
 Roe ONEIDA Ruth, thank you for joining [name], PA-C for today's virtual visit.  While this provider is not your primary care provider (PCP), if your PCP is located in our provider database this encounter information will be shared with them immediately following your visit.   A [facility] MyChart account gives you access to today's visit and all your visits, tests, and labs performed at [facility]  click here if you don't have a [facility] MyChart account or go to [email or web address]  Consent: (Patient) MIQUEL LAMSON provided verbal consent for this virtual visit at the beginning of the encounter.  Current Medications:  Current Outpatient Medications:    fluconazole  (DIFLUCAN ) 150 MG tablet, Take 1 tablet (150 mg total) by mouth once for 1 dose., Disp: 1 tablet, Rfl: 0   ARIPiprazole  (ABILIFY ) 10 MG tablet, Take 1 tablet (10 mg total) by mouth daily., Disp: 90 tablet, Rfl: 0   azaTHIOprine (IMURAN) 50 MG tablet, Take 100 mg by mouth daily as needed., Disp: , Rfl:    escitalopram  (LEXAPRO ) 20 MG tablet, Take 1 tablet (20 mg total) by mouth daily., Disp: 90 tablet, Rfl: 0   hydroxychloroquine (PLAQUENIL) 200 MG tablet, Take 200 mg by mouth daily., Disp: , Rfl:    metroNIDAZOLE  (FLAGYL ) 500 MG tablet, Take 1 tablet (500 mg total) by mouth 2 (two) times daily., Disp: 14 tablet, Rfl: 0   traZODone  (DESYREL ) 100 MG tablet, Take 1 tablet (100 mg total) by mouth at bedtime., Disp: 90 tablet, Rfl: 0   triamcinolone  cream (KENALOG ) 0.1 %, Apply 1 application topically 2 (two) times daily., Disp: 45 g, Rfl: 3   Vitamin D , Ergocalciferol , (DRISDOL) 1.25 MG (50000 UNIT) CAPS capsule, Take 50,000 Units by mouth once a week., Disp: , Rfl:    Medications ordered in this encounter:  Meds ordered this encounter  Medications   fluconazole  (DIFLUCAN ) 150 MG tablet    Sig: Take 1 tablet (150 mg total) by mouth once for 1 dose.    Dispense:  1 tablet    Refill:  0     *If you  need refills on other medications prior to your next appointment, please contact your pharmacy*  Follow-Up: Call back or seek an in-person evaluation if the symptoms worsen or if the condition fails to improve as anticipated.   Virtual Care [phone]  Other Instructions Vaginal Yeast Infection, Adult  Vaginal yeast infection is a condition that causes vaginal discharge as well as soreness, swelling, and redness (inflammation) of the vagina. This is a common condition. Some women get this infection frequently. What are the causes? This condition is caused by a change in the normal balance of the yeast (Candida) and normal bacteria that live in the vagina. This change causes an overgrowth of yeast, which causes the inflammation. What increases the risk? The condition is more likely to develop in women who: Take antibiotic medicines. Have diabetes. Take birth control pills. Are pregnant. Douche often. Have a weak body defense system (immune system). Have been taking steroid medicines for a long time. Frequently wear tight clothing. What are the signs or symptoms? Symptoms of this condition include: White, thick, creamy vaginal discharge. Swelling, itching, redness, and irritation of the vagina. The lips of the vagina (labia) may be affected as well. Pain or a burning feeling while urinating. Pain during sex. How is this diagnosed? This condition is diagnosed based on: Your medical history. A physical exam. A pelvic exam. Your health care provider will  examine a sample of your vaginal discharge under a microscope. Your health care provider may send this sample for testing to confirm the diagnosis. How is this treated? This condition is treated with medicine. Medicines may be over-the-counter or prescription. You may be told to use one or more of the following: Medicine that is taken by mouth (orally). Medicine that is applied as a cream (topically). Medicine that is  inserted directly into the vagina (suppository). Follow these instructions at home: Take or apply over-the-counter and prescription medicines only as told by your health care provider. Do not use tampons until your health care provider approves. Do not have sex until your infection has cleared. Sex can prolong or worsen your symptoms of infection. Ask your health care provider when it is safe to resume sexual activity. Keep all follow-up visits. This is important. How is this prevented?  Do not wear tight clothes, such as pantyhose or tight pants. Wear breathable cotton underwear. Do not use douches, perfumed soap, creams, or powders. Wipe from front to back after using the toilet. If you have diabetes, keep your blood sugar levels under control. Ask your health care provider for other ways to prevent yeast infections. Contact a health care provider if: You have a fever. Your symptoms go away and then return. Your symptoms do not get better with treatment. Your symptoms get worse. You have new symptoms. You develop blisters in or around your vagina. You have blood coming from your vagina and it is not your menstrual period. You develop pain in your abdomen. Summary Vaginal yeast infection is a condition that causes discharge as well as soreness, swelling, and redness (inflammation) of the vagina. This condition is treated with medicine. Medicines may be over-the-counter or prescription. Take or apply over-the-counter and prescription medicines only as told by your health care provider. Do not douche. Resume sexual activity or use of tampons as instructed by your health care provider. Contact a health care provider if your symptoms do not get better with treatment or your symptoms go away and then return. This information is not intended to replace advice given to you by your health care provider. Make sure you discuss any questions you have with your health care provider. Document  Revised: 07/20/2020 Document Reviewed: 07/20/2020 Elsevier Patient Education  2024 [employer]vier Inc.   If you have been instructed to have an in-person evaluation today at a local Urgent Care facility, please use the link below. It will take you to a list of all of our available [facility], including address, phone number and hours of operation. Please do not delay care.  [facility]  If you or a family member do not have a primary care provider, use the link below to schedule a visit and establish care. When you choose a [facility] primary care physician or advanced practice provider, you gain a long-term partner in health. Find a Primary Care Provider  Learn more about [facility]'s in-office and virtual care options: [facility] - Get Care Now

## 2023-12-20 ENCOUNTER — Encounter

## 2023-12-20 ENCOUNTER — Telehealth: Admitting: Family Medicine

## 2023-12-20 DIAGNOSIS — B3731 Acute candidiasis of vulva and vagina: Secondary | ICD-10-CM | POA: Diagnosis not present

## 2023-12-20 MED ORDER — FLUCONAZOLE 150 MG PO TABS
150.0000 mg | ORAL_TABLET | Freq: Every day | ORAL | 0 refills | Status: AC
Start: 2023-12-20 — End: 2023-12-22

## 2023-12-20 NOTE — Progress Notes (Signed)
 Virtual Visit Consent   Yvonne Christensen, you are scheduled for a virtual visit with a [facility] provider today. Just as with appointments in the office, your consent must be obtained to participate. Your consent will be active for this visit and any virtual visit you may have with one of our providers in the next 365 days. If you have a MyChart account, a copy of this consent can be sent to you electronically.  As this is a virtual visit, video technology does not allow for your provider to perform a traditional examination. This may limit your provider's ability to fully assess your condition. If your provider identifies any concerns that need to be evaluated in person or the need to arrange testing (such as labs, EKG, etc.), we will make arrangements to do so. Although advances in technology are sophisticated, we cannot ensure that it will always work on either your end or our end. If the connection with a video visit is poor, the visit may have to be switched to a telephone visit. With either a video or telephone visit, we are not always able to ensure that we have a secure connection.  By engaging in this virtual visit, you consent to the provision of healthcare and authorize for your insurance to be billed (if applicable) for the services provided during this visit. Depending on your insurance coverage, you may receive a charge related to this service.  I need to obtain your verbal consent now. Are you willing to proceed with your visit today? Yvonne Christensen has provided verbal consent on 12/20/2023 for a virtual visit (video or telephone). Loa Lamp, FNP  Date: 12/20/2023 6:10 PM   Virtual Visit via Video Note   I, Loa Lamp, connected with  Yvonne Christensen  ([id number], [birth year]) on 12/20/23 at  6:15 PM EDT by a video-enabled telemedicine application and verified that I am speaking with the correct person using two identifiers.  Location: Patient: Virtual Visit Location Patient:  Home Provider: Virtual Visit Location Provider: Home Office   I discussed the limitations of evaluation and management by telemedicine and the availability of in person appointments. The patient expressed understanding and agreed to proceed.    History of Present Illness: Yvonne Christensen is a 28 y.o. who identifies as a female who was assigned female at birth, and is being seen today for vaginal itching, thick clumpy white discharge after miscarriage and period. No odor. No fever. Yvonne Christensen  HPI: HPI  Problems:  Patient Active Problem List   Diagnosis Date Noted   Encounter for annual health examination 11/13/2023   Sore throat 11/13/2023   History of miscarriage 11/13/2023   Discoid lupus erythematosus 11/09/2022   Vitamin D  deficiency 11/09/2022   Routine general medical examination at health care facility 11/09/2022   COVID-19 vaccination declined 11/09/2022   Family history of breast cancer in mother 11/09/2022   Mixed bipolar I disorder (HCC) 08/10/2022   Pain in joint, pelvic region and thigh 08/10/2022   Patellofemoral syndrome 08/10/2022   Sjogren syndrome with keratoconjunctivitis (HCC) 11/04/2021   Anxiety 06/01/2021   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 Take one tab today and repeat in 3 days as needed., Disp: 2 tablet, Rfl: 0   ARIPiprazole  (ABILIFY ) 10 MG tablet, Take 1 tablet (10 mg total) by mouth daily., Disp: 90 tablet, Rfl: 0   azaTHIOprine (IMURAN) 50 MG tablet, Take 100 mg by mouth daily as needed., Disp: , Rfl:    escitalopram  (LEXAPRO ) 20 MG tablet, Take 1 tablet (20 mg total) by mouth daily., Disp: 90 tablet, Rfl:  0   hydroxychloroquine (PLAQUENIL) 200 MG tablet, Take 200 mg by mouth daily., Disp: , Rfl:    metroNIDAZOLE  (FLAGYL ) 500 MG tablet, Take 1 tablet (500 mg total) by mouth 2 (two) times daily., Disp: 14 tablet, Rfl: 0   traZODone  (DESYREL ) 100 MG tablet, Take 1 tablet (100 mg total) by mouth at bedtime., Disp: 90 tablet, Rfl: 0   triamcinolone  cream (KENALOG ) 0.1 %, Apply 1 application topically 2 (two) times daily., Disp: 45 g, Rfl: 3   Vitamin D , Ergocalciferol , (DRISDOL) 1.25 MG (50000 UNIT) CAPS capsule, Take 50,000 Units by mouth once a week., Disp: , Rfl:   Observations/Objective: Patient is well-developed, well-nourished in no acute distress.  Resting comfortably  at home.  Head is normocephalic, atraumatic.  No labored breathing.  Speech is clear and coherent with logical content.  Patient is alert and oriented at baseline.    Assessment and Plan: 1. Candidiasis of vagina (Primary)  See educational information attached, see UC or GYN as needed.   Follow Up Instructions: I discussed the assessment and treatment plan with the patient. The patient was provided an opportunity to ask questions and all were answered. The patient agreed with the plan and demonstrated an understanding of the instructions.  A copy of instructions were sent to the patient via MyChart unless otherwise noted below.     The patient was advised to call back or seek an in-person evaluation if the symptoms worsen or if the condition fails to improve as anticipated.    Robby Bulkley, FNP

## 2023-12-20 NOTE — Patient Instructions (Signed)

## 2024-01-11 ENCOUNTER — Encounter (HOSPITAL_COMMUNITY)
Admission: AD | Disposition: A | Discharge status: Home / Self Care | Payer: Self-pay | Source: Home / Self Care | Attending: Obstetrics & Gynecology

## 2024-01-11 ENCOUNTER — Inpatient Hospital Stay (HOSPITAL_COMMUNITY)
Admission: AD | Admit: 2024-01-11 | Discharge: 2024-01-11 | Disposition: A | Discharge status: Home / Self Care | Attending: Obstetrics & Gynecology | Admitting: Obstetrics & Gynecology

## 2024-01-11 ENCOUNTER — Inpatient Hospital Stay (HOSPITAL_COMMUNITY)

## 2024-01-11 ENCOUNTER — Encounter (HOSPITAL_COMMUNITY): Payer: Self-pay | Admitting: *Deleted

## 2024-01-11 ENCOUNTER — Inpatient Hospital Stay (HOSPITAL_COMMUNITY): Admitting: Certified Registered Nurse Anesthetist

## 2024-01-11 ENCOUNTER — Other Ambulatory Visit: Payer: Self-pay

## 2024-01-11 ENCOUNTER — Inpatient Hospital Stay (EMERGENCY_DEPARTMENT_HOSPITAL): Admitting: Certified Registered Nurse Anesthetist

## 2024-01-11 DIAGNOSIS — Z3A01 Less than 8 weeks gestation of pregnancy: Secondary | ICD-10-CM | POA: Insufficient documentation

## 2024-01-11 DIAGNOSIS — O009 Unspecified ectopic pregnancy without intrauterine pregnancy: Secondary | ICD-10-CM | POA: Diagnosis not present

## 2024-01-11 DIAGNOSIS — R109 Unspecified abdominal pain: Secondary | ICD-10-CM

## 2024-01-11 DIAGNOSIS — Z3A17 17 weeks gestation of pregnancy: Secondary | ICD-10-CM | POA: Diagnosis not present

## 2024-01-11 DIAGNOSIS — O00102 Left tubal pregnancy without intrauterine pregnancy: Secondary | ICD-10-CM | POA: Insufficient documentation

## 2024-01-11 DIAGNOSIS — Z3A00 Weeks of gestation of pregnancy not specified: Secondary | ICD-10-CM | POA: Diagnosis not present

## 2024-01-11 HISTORY — DX: Unspecified abnormal cytological findings in specimens from vagina: R87.629

## 2024-01-11 HISTORY — PX: DIAGNOSTIC LAPAROSCOPY WITH REMOVAL OF ECTOPIC PREGNANCY: SHX6449

## 2024-01-11 LAB — URINALYSIS, ROUTINE W REFLEX MICROSCOPIC
Bilirubin Urine: NEGATIVE
Glucose, UA: NEGATIVE mg/dL
Hgb urine dipstick: NEGATIVE
Ketones, ur: NEGATIVE mg/dL
Leukocytes,Ua: NEGATIVE
Nitrite: NEGATIVE
Protein, ur: NEGATIVE mg/dL
Specific Gravity, Urine: 1.019 (ref 1.005–1.030)
pH: 7 (ref 5.0–8.0)

## 2024-01-11 LAB — WET PREP, GENITAL
Clue Cells Wet Prep HPF POC: NONE SEEN
Sperm: NONE SEEN
Trich, Wet Prep: NONE SEEN
WBC, Wet Prep HPF POC: <10 (ref <10)
Yeast Wet Prep HPF POC: NONE SEEN

## 2024-01-11 LAB — POCT PREGNANCY, URINE: Preg Test, Ur: POSITIVE — AB

## 2024-01-11 LAB — CBC
HCT: 37.2 % (ref 36.0–46.0)
Hemoglobin: 12.5 g/dL (ref 12.0–15.0)
MCH: 27.5 pg (ref 26.0–34.0)
MCHC: 33.6 g/dL (ref 30.0–36.0)
MCV: 81.9 fL (ref 80.0–100.0)
Platelets: 283 K/uL (ref 150–400)
RBC: 4.54 MIL/uL (ref 3.87–5.11)
RDW: 12.5 % (ref 11.5–15.5)
WBC: 7.7 K/uL (ref 4.0–10.5)
nRBC: 0 % (ref 0.0–0.2)

## 2024-01-11 LAB — PREPARE RBC (CROSSMATCH)

## 2024-01-11 LAB — HCG, QUANTITATIVE, PREGNANCY: hCG, Beta Chain, Quant, S: 1295 m[IU]/mL — ABNORMAL HIGH (ref <5)

## 2024-01-11 SURGERY — LAPAROSCOPY, WITH ECTOPIC PREGNANCY SURGICAL TREATMENT
Anesthesia: General | Site: Abdomen

## 2024-01-11 MED ORDER — OXYCODONE HCL 5 MG PO TABS: 5.0000 mg | ORAL_TABLET | ORAL | 0 refills | Status: AC | PRN

## 2024-01-11 MED ORDER — ACETAMINOPHEN 500 MG PO TABS: 1000.0000 mg | ORAL_TABLET | Freq: Four times a day (QID) | ORAL | 0 refills | Status: AC | PRN

## 2024-01-11 MED ORDER — FENTANYL CITRATE (PF) 100 MCG/2ML IJ SOLN
100.0000 ug | Freq: Once | INTRAMUSCULAR | Status: AC
  Administered 2024-01-11: 100 ug via INTRAVENOUS

## 2024-01-11 MED ORDER — OXYCODONE HCL 5 MG PO TABS
5.0000 mg | ORAL_TABLET | Freq: Once | ORAL | Status: AC | PRN
  Administered 2024-01-11: 5 mg via ORAL

## 2024-01-11 MED ORDER — FENTANYL CITRATE (PF) 100 MCG/2ML IJ SOLN
25.0000 ug | INTRAMUSCULAR | Status: DC | PRN
  Administered 2024-01-11 (×2): 25 ug via INTRAVENOUS
  Administered 2024-01-11: 50 ug via INTRAVENOUS

## 2024-01-11 MED ORDER — PROPOFOL 10 MG/ML IV BOLUS
INTRAVENOUS | Status: DC | PRN
  Administered 2024-01-11: 200 mg via INTRAVENOUS

## 2024-01-11 MED ORDER — ACETAMINOPHEN 10 MG/ML IV SOLN
INTRAVENOUS | Status: AC
  Filled 2024-01-11: qty 100

## 2024-01-11 MED ORDER — LACTATED RINGERS IV SOLN: INTRAVENOUS | Status: DC | PRN

## 2024-01-11 MED ORDER — ONDANSETRON HCL 4 MG/2ML IJ SOLN
INTRAMUSCULAR | Status: AC
  Filled 2024-01-11: qty 2

## 2024-01-11 MED ORDER — LACTATED RINGERS IV BOLUS
1000.0000 mL | Freq: Once | INTRAVENOUS | Status: AC
  Administered 2024-01-11: 1000 mL via INTRAVENOUS

## 2024-01-11 MED ORDER — FENTANYL CITRATE (PF) 250 MCG/5ML IJ SOLN
INTRAMUSCULAR | Status: AC
  Filled 2024-01-11: qty 5

## 2024-01-11 MED ORDER — DEXAMETHASONE SODIUM PHOSPHATE 10 MG/ML IJ SOLN
INTRAMUSCULAR | Status: DC | PRN
  Administered 2024-01-11: 10 mg via INTRAVENOUS

## 2024-01-11 MED ORDER — HEMOSTATIC AGENTS (NO CHARGE) OPTIME
TOPICAL | Status: DC | PRN
Start: 2024-01-11 — End: 2024-01-11
  Administered 2024-01-11: 1 via TOPICAL

## 2024-01-11 MED ORDER — OXYCODONE HCL 5 MG/5ML PO SOLN: 5.0000 mg | Freq: Once | ORAL | Status: AC | PRN

## 2024-01-11 MED ORDER — ONDANSETRON HCL 4 MG/2ML IJ SOLN
4.0000 mg | Freq: Once | INTRAMUSCULAR | Status: AC
  Administered 2024-01-11: 4 mg via INTRAVENOUS

## 2024-01-11 MED ORDER — SODIUM CHLORIDE 0.9 % IR SOLN
Status: DC | PRN
Start: 2024-01-11 — End: 2024-01-11
  Administered 2024-01-11: 1000 mL

## 2024-01-11 MED ORDER — SENNA 8.6 MG PO TABS: 2.0000 | ORAL_TABLET | Freq: Every evening | ORAL | 0 refills | Status: AC | PRN

## 2024-01-11 MED ORDER — 0.9 % SODIUM CHLORIDE (POUR BTL) OPTIME
TOPICAL | Status: DC | PRN
Start: 2024-01-11 — End: 2024-01-11
  Administered 2024-01-11: 1000 mL

## 2024-01-11 MED ORDER — PROPOFOL 10 MG/ML IV BOLUS
INTRAVENOUS | Status: AC
  Filled 2024-01-11: qty 20

## 2024-01-11 MED ORDER — MIDAZOLAM HCL 2 MG/2ML IJ SOLN
INTRAMUSCULAR | Status: AC
  Filled 2024-01-11: qty 2

## 2024-01-11 MED ORDER — OXYCODONE HCL 5 MG PO TABS
ORAL_TABLET | ORAL | Status: AC
  Filled 2024-01-11: qty 1

## 2024-01-11 MED ORDER — CHLORHEXIDINE GLUCONATE 0.12 % MT SOLN
OROMUCOSAL | Status: AC
  Filled 2024-01-11: qty 15

## 2024-01-11 MED ORDER — SUGAMMADEX SODIUM 200 MG/2ML IV SOLN
INTRAVENOUS | Status: DC | PRN
  Administered 2024-01-11: 200 mg via INTRAVENOUS

## 2024-01-11 MED ORDER — ACETAMINOPHEN 10 MG/ML IV SOLN
1000.0000 mg | Freq: Once | INTRAVENOUS | Status: DC | PRN
  Administered 2024-01-11: 1000 mg via INTRAVENOUS

## 2024-01-11 MED ORDER — DEXMEDETOMIDINE HCL IN NACL 80 MCG/20ML IV SOLN
INTRAVENOUS | Status: DC | PRN
  Administered 2024-01-11: 8 ug via INTRAVENOUS

## 2024-01-11 MED ORDER — ONDANSETRON HCL 4 MG/2ML IJ SOLN
INTRAMUSCULAR | Status: DC | PRN
  Administered 2024-01-11: 4 mg via INTRAVENOUS

## 2024-01-11 MED ORDER — PHENYLEPHRINE HCL-NACL 20-0.9 MG/250ML-% IV SOLN
INTRAVENOUS | Status: DC | PRN
  Administered 2024-01-11: 50 ug/min via INTRAVENOUS

## 2024-01-11 MED ORDER — PHENYLEPHRINE 80 MCG/ML (10ML) SYRINGE FOR IV PUSH (FOR BLOOD PRESSURE SUPPORT)
PREFILLED_SYRINGE | INTRAVENOUS | Status: DC | PRN
  Administered 2024-01-11: 80 ug via INTRAVENOUS
  Administered 2024-01-11: 160 ug via INTRAVENOUS

## 2024-01-11 MED ORDER — ROCURONIUM BROMIDE 10 MG/ML (PF) SYRINGE
PREFILLED_SYRINGE | INTRAVENOUS | Status: DC | PRN
  Administered 2024-01-11: 50 mg via INTRAVENOUS
  Administered 2024-01-11: 10 mg via INTRAVENOUS

## 2024-01-11 MED ORDER — ONDANSETRON 4 MG PO TBDP
4.0000 mg | ORAL_TABLET | Freq: Once | ORAL | Status: AC
  Administered 2024-01-11: 4 mg via ORAL
  Filled 2024-01-11: qty 1

## 2024-01-11 MED ORDER — MIDAZOLAM HCL 2 MG/2ML IJ SOLN
INTRAMUSCULAR | Status: DC | PRN
  Administered 2024-01-11 (×2): 2 mg via INTRAVENOUS

## 2024-01-11 MED ORDER — FENTANYL CITRATE (PF) 250 MCG/5ML IJ SOLN
INTRAMUSCULAR | Status: DC | PRN
  Administered 2024-01-11: 100 ug via INTRAVENOUS
  Administered 2024-01-11: 50 ug via INTRAVENOUS

## 2024-01-11 MED ORDER — FENTANYL CITRATE (PF) 100 MCG/2ML IJ SOLN
12.5000 ug | Freq: Once | INTRAMUSCULAR | Status: DC
  Filled 2024-01-11: qty 2

## 2024-01-11 MED ORDER — IBUPROFEN 600 MG PO TABS: 600.0000 mg | ORAL_TABLET | Freq: Four times a day (QID) | ORAL | 0 refills | Status: AC | PRN

## 2024-01-11 MED ORDER — FENTANYL CITRATE (PF) 100 MCG/2ML IJ SOLN
INTRAMUSCULAR | Status: AC
Start: 2024-01-11 — End: 2024-01-11
  Filled 2024-01-11: qty 2

## 2024-01-11 MED ORDER — ACETAMINOPHEN 500 MG PO TABS
1000.0000 mg | ORAL_TABLET | Freq: Once | ORAL | Status: AC
  Administered 2024-01-11: 1000 mg via ORAL
  Filled 2024-01-11: qty 2

## 2024-01-11 MED ORDER — ALBUMIN HUMAN 5 % IV SOLN: INTRAVENOUS | Status: DC | PRN

## 2024-01-11 MED ORDER — LIDOCAINE 2% (20 MG/ML) 5 ML SYRINGE
INTRAMUSCULAR | Status: DC | PRN
  Administered 2024-01-11: 60 mg via INTRAVENOUS

## 2024-01-11 SURGICAL SUPPLY — 30 items
APPLICATOR ARISTA FLEXITIP XL (MISCELLANEOUS) IMPLANT
BAG COUNTER SPONGE SURGICOUNT (BAG) ×1 IMPLANT
COVER MAYO STAND STRL (DRAPES) ×1 IMPLANT
DEFOGGER SCOPE WARM SEASHARP (MISCELLANEOUS) IMPLANT
DERMABOND ADVANCED .7 DNX12 (GAUZE/BANDAGES/DRESSINGS) ×1 IMPLANT
DRAPE SURG IRRIG POUCH 19X23 (DRAPES) ×1 IMPLANT
DURAPREP 26ML APPLICATOR (WOUND CARE) ×1 IMPLANT
GLOVE BIO SURGEON STRL SZ7 (GLOVE) ×1 IMPLANT
GLOVE BIOGEL PI IND STRL 7.0 (GLOVE) ×4 IMPLANT
GOWN STRL REUS W/ TWL XL LVL3 (GOWN DISPOSABLE) ×1 IMPLANT
HEMOSTAT ARISTA ABSORB 3G PWDR (HEMOSTASIS) IMPLANT
IRRIGATION SUCT STRKRFLW 2 WTP (MISCELLANEOUS) IMPLANT
KIT PINK PAD W/HEAD ARM REST (MISCELLANEOUS) ×1 IMPLANT
KIT TURNOVER KIT B (KITS) ×1 IMPLANT
LIGASURE LAP MARYLAND 5MM 37CM (ELECTROSURGICAL) IMPLANT
LIGASURE VESSEL 5MM BLUNT TIP (ELECTROSURGICAL) IMPLANT
MANIFOLD NEPTUNE II (INSTRUMENTS) IMPLANT
PACK LAPAROSCOPY BASIN (CUSTOM PROCEDURE TRAY) ×1 IMPLANT
SEALER TISSUE G2 CVD JAW 35 (ENDOMECHANICALS) ×1 IMPLANT
SET TUBE SMOKE EVAC HIGH FLOW (TUBING) ×1 IMPLANT
SLEEVE ADV FIXATION 5X100MM (TROCAR) ×1 IMPLANT
SUT VIC AB 4-0 PS2 18 (SUTURE) ×1 IMPLANT
SUT VICRYL 0 UR6 27IN ABS (SUTURE) ×1 IMPLANT
SYSTEM BAG RETRIEVAL 10MM (BASKET) IMPLANT
TOWEL GREEN STERILE FF (TOWEL DISPOSABLE) ×1 IMPLANT
TRAY FOLEY W/BAG SLVR 14FR (SET/KITS/TRAYS/PACK) ×1 IMPLANT
TROCAR 11X100 Z THREAD (TROCAR) ×1 IMPLANT
TROCAR ADV FIXATION 5X100MM (TROCAR) ×1 IMPLANT
TROCAR BALLN 12MMX100 BLUNT (TROCAR) ×1 IMPLANT
WARMER LAPAROSCOPE (MISCELLANEOUS) ×1 IMPLANT

## 2024-01-11 NOTE — Anesthesia Procedure Notes (Signed)
 Procedure Name: Intubation Date/Time: 01/11/2024 4:23 PM  Performed by: Arvell Edsel HERO, CRNAPre-anesthesia Checklist: Patient identified, Emergency Drugs available, Suction available, Patient being monitored and Timeout performed Patient Re-evaluated:Patient Re-evaluated prior to induction Oxygen Delivery Method: Circle system utilized Preoxygenation: Pre-oxygenation with 100% oxygen Induction Type: IV induction Ventilation: Mask ventilation without difficulty and Oral airway inserted - appropriate to patient size Laryngoscope Size: Mac and 3 Grade View: Grade I Tube size: 7.0 mm Number of attempts: 1 Airway Equipment and Method: Patient positioned with wedge pillow and Stylet Placement Confirmation: ETT inserted through vocal cords under direct vision, positive ETCO2, CO2 detector and breath sounds checked- equal and bilateral Secured at: 21 cm Tube secured with: Tape

## 2024-01-11 NOTE — Discharge Instructions (Addendum)
 Post-surgical Instructions, Outpatient Surgery  You may expect to feel dizzy, weak, and drowsy for as long as 24 hours after receiving the medicine that made you sleep (anesthetic). For the first 24 hours after your surgery:   Do not drive a car, ride a bicycle, participate in physical activities, or take public transportation until you are done taking narcotic pain medicines Do not drink alcohol or take tranquilizers.  Do not take medicine that has not been prescribed by your physicians.  Do not sign important papers or make important decisions while on narcotic pain medicines.  Have a responsible person with you.   CARE OF INCISION If you have a bandage, you may remove it in one day.  If there are steri-strips or dermabond, just let this loosen on its own.  You may shower on the first day after your surgery.  Do not sit in a tub bath for four weeks. Avoid heavy lifting (more than 10-15 pounds/4.5 kilograms), pushing, or pulling.  Avoid activities that may risk injury to your incisions.   PAIN MANAGEMENT Ibuprofen  600mg .  (This is the same as 3-200mg  over the counter tablets of Motrin  or ibuprofen .)  Take this every 6 hours or as needed for cramping.   Acetaminophen  1000mg  (This is the same as 2-500mg  over the counter extra strength tylenol ). Take this every 6 hours for the first 3 days or as needed afterwards for pain Oxycodone  5mg  For more severe pain, take one or two tablets every four to six hours as needed for pain control.  (Remember that narcotic pain medications increase your risk of constipation.  If this becomes a problem, you may take an over the counter laxative like miralax.)  DO'S AND DON'T'S Do not take a tub bath for 4 weeks.  You may shower on the first day after your surgery Do not do any heavy lifting for four weeks. Do move around as you feel able.  Stairs are fine.  You may begin to exercise again as you feel able.  Do not lift any weights for four weeks. Do not put  anything in the vagina for 1-2 weeks--no tampons, intercourse, or douching.    REGULAR MEDIATIONS/VITAMINS: You may restart all of your regular medications as prescribed. You may restart all of your vitamins as you normally take them.    PLEASE CALL OR SEEK MEDICAL CARE IF: You have persistent nausea and vomiting.  You have trouble eating or drinking.  You have an oral temperature above 100.5.  You have constipation that is not helped by adjusting diet or increasing fluid intake. Pain medicines are a common cause of constipation.  You have heavy vaginal bleeding You have redness or drainage from your incision(s) or there is increasing pain or tenderness near or in the surgical site.

## 2024-01-11 NOTE — MAU Note (Signed)
 Blood work drawn, US  paged.

## 2024-01-11 NOTE — Op Note (Signed)
 Yvonne Christensen PROCEDURE DATE: 01/11/2024  PREOPERATIVE DIAGNOSIS: Ruptured ectopic pregnancy POSTOPERATIVE DIAGNOSIS: Ruptured left fallopian tube ectopic pregnancy PROCEDURE: Laparoscopic left salpingectomy and removal of ectopic pregnancy SURGEON:  Dr. Kieth Carolin ASSISTANT: Dr. Vonn Inch. An experienced assistant was needed for retraction and visualization due to adhesive disease encountered during the care.  ANESTHESIOLOGY TEAM: Anesthesiologist: Epifanio Charleston, MD CRNA: Arvell Edsel HERO, CRNA  INDICATIONS: 28 y.o. [phone] at [redacted]w[redacted]d here with the preoperative diagnoses as listed above.  Please refer to preoperative notes for more details. Patient was counseled regarding need for laparoscopic salpingectomy. Risks of surgery including bleeding which may require transfusion or reoperation, infection, injury to bowel or other surrounding organs, need for additional procedures including laparotomy and other postoperative/anesthesia complications were explained to patient.  Written informed consent was obtained.  FINDINGS:  Moderate amount of hemoperitoneum estimated to be about 300cc of blood and clots.  Large clot present at fimbriated end of left fallopian tube.  Adhesive disease between tube, bowel and anterior abdominal wall.  Uterus was adherent to the anterior abdominal wall to the fundus Adhesive disease of a loop of small bowel to anterior abdominal wall with healthy appearance, see intra op images Adhesive disease between omentum and anterior abdominal wall that was taken down during the case.   ANESTHESIA: General INTRAVENOUS FLUIDS: 1300 ml ESTIMATED BLOOD LOSS: 5 ml intra op, ~300cc of hemoperitoneum  URINE OUTPUT: 400 ml SPECIMENS: Left fallopian tube containing ectopic gestation COMPLICATIONS: None immediate  PROCEDURE IN DETAIL:  The patient was taken to the operating room where general anesthesia was administered and was found to be adequate.  She was placed in  the dorsal lithotomy position, and was prepped and draped in a sterile manner.  A Foley catheter was inserted into her bladder and attached to constant drainage. A sponge stick was placed into the vagina for manipulation.  She was prepped and draped in the usual sterile fashion in the dorsal lithotomy position. A 1cm umbilical incision was made. The fascia was identified with blunt dissection. The fascia was elevated and incised sharply. The peritoneum was grasped, elevated and entered sharply. A 10mm Hasson trocar was introduced. After confirming intraperitoneal placement of the trocar, the abdomen was insufflated. The bowel/omentum below the point of entry were carefully inspected with no evidence of injury.  Attention was turned to the pelvis with the above findings noted. A LLQ port was placed under direct visualization. The laparoscope was placed through the lateral port and confirmed no injury to omentum/bowel with entry or trocar placement. Patient was then placed in steep trendelenburg. A 5mm RLQ incision was made and another 5mm port was placed under direct visualization.  The Nezhat suction irrigator was then used to suction the hemoperitoneum and irrigate the pelvis. Attention was then turned to the left fallopian tube which was grasped and ligated from the underlying mesosalpinx and uterine attachment using the Ligasure. There were some filmy adhesions to underlying bowel that were taken down using blunt dissection. Electrocautery was only used well away from bowel. Good hemostasis was noted. The specimen was placed in an EndoCatch bag and removed from the abdomen intact. Additional hemoperitoneum was evacuated. The omental adhesions were taken down with the Ligasure well away from any underlying bowel. Arista applied to the operative sites. Good hemostasis noted.   The lateral ports were removed under direct visualization and the abdomen was desufflated. The umbilical fascia was closed with 2  interrupted sutures of 0-vicryl.  The incisions were then closed  in a subcuticular fashion with 4-0 vicryl and dressings were placed. She was taken to recovery in stable condition.  Rh status: positive   The patient will be discharged to home as per PACU criteria.  Routine postoperative instructions given.  She was prescribed oxycodone , ibuprofen , tylenol , and senna.    She will follow up in the office in about 4 weeks for postoperative evaluation.  Kieth Carolin, MD Obstetrician & Gynecologist, [facility] for [employer], [facility]

## 2024-01-11 NOTE — Transfer of Care (Signed)
 Immediate Anesthesia Transfer of Care Note  Patient: Yvonne Christensen  Procedure(s) Performed: LAPAROSCOPY, WITH ECTOPIC PREGNANCY SURGICAL TREATMENT (Abdomen)  Patient Location: PACU  Anesthesia Type:General  Level of Consciousness: drowsy and patient cooperative  Airway & Oxygen Therapy: Patient Spontanous Breathing and Patient connected to face mask oxygen  Post-op Assessment: Report given to RN, Post -op Vital signs reviewed and stable, Patient moving all extremities, and Patient moving all extremities X 4  Post vital signs: Reviewed and stable  Last Vitals:  Vitals Value Taken Time  BP 101/73 01/11/24 17:38  Temp 36.6 C 01/11/24 17:38  Pulse 58 01/11/24 17:39  Resp 17 01/11/24 17:39  SpO2 100 % 01/11/24 17:39  Vitals shown include unfiled device data.  Last Pain:  Vitals:   01/11/24 1340  TempSrc:   PainSc: 6          Complications: No notable events documented.

## 2024-01-11 NOTE — MAU Note (Signed)
 Dr Ilean in explaining US  report, need for surgery.  Pt consented.

## 2024-01-11 NOTE — H&P (Signed)
 Chief Complaint: Nausea and Abdominal Pain   Event Date/Time   First Provider Initiated Contact with Patient 01/11/24 [id number]     SUBJECTIVE HPI: Yvonne Christensen is a 28 y.o. [id number] at [redacted]w[redacted]d who presents to [facility] reporting abdominal pain, Nausea, with spotting. Pt w/ history of abortion and miscarriage. State abdominal/pelvic pain started yesterday and her abdominal has been very sore to touch. She recently return from her vacation in [place]. State diarrhea in yesterday x 3. She denies fever, vaginal bleeding / clot, or dizziness.   Past Medical History:  Diagnosis Date   Allergy    Bacterial vaginosis 03/2016   being treated, dosnot remember med name   Decreased fetal movement in pregnancy, antepartum 04/23/2016   Depression    Leakage of amniotic fluid 03/25/2016   Lupus    Sjogren's syndrome (HCC)    OB History  Gravida Para Term Preterm AB Living  4    1   SAB IAB Ectopic Multiple Live Births     0     # Outcome Date GA Lbr Len/2nd Weight Sex Type Anes PTL Lv  4 Current           3 Gravida           2 AB           1 Gravida            Past Surgical History:  Procedure Laterality Date   CESAREAN SECTION  05/12/2016   WISDOM TOOTH EXTRACTION  2013   g/a   Social History   Socioeconomic History   Marital status: Single    Spouse name: Not on file   Number of children: Not on file   Years of education: Not on file   Highest education level: Not on file  Occupational History   Not on file  Tobacco Use   Smoking status: Never   Smokeless tobacco: Never  Vaping Use   Vaping status: Never Used  Substance and Sexual Activity   Alcohol use: Not Currently   Drug use: No   Sexual activity: Yes    Birth control/protection: None  Other Topics Concern   Not on file  Social History Narrative   Not on file   Social Drivers of Health   Financial Resource Strain: Not on file  Food Insecurity: Not on file  Transportation Needs: Not on file  Physical  Activity: Not on file  Stress: Not on file  Social Connections: Not on file  Intimate Partner Violence: Not on file   Family History  Problem Relation Age of Onset   Breast cancer Mother    Bipolar disorder Mother    Cirrhosis Father    ADD / ADHD Sister    Hypertension Maternal Grandmother    Prostate cancer Maternal Grandfather    Bipolar disorder Paternal Grandmother    No current facility-administered medications on file prior to encounter.   Current Outpatient Medications on File Prior to Encounter  Medication Sig Dispense Refill   ARIPiprazole  (ABILIFY ) 10 MG tablet Take 1 tablet (10 mg total) by mouth daily. 90 tablet 0   azaTHIOprine (IMURAN) 50 MG tablet Take 100 mg by mouth daily as needed.     escitalopram  (LEXAPRO ) 20 MG tablet Take 1 tablet (20 mg total) by mouth daily. 90 tablet 0   hydroxychloroquine (PLAQUENIL) 200 MG tablet Take 200 mg by mouth daily.     metroNIDAZOLE  (FLAGYL ) 500 MG tablet Take 1 tablet (500 mg total) by  mouth 2 (two) times daily. 14 tablet 0   traZODone  (DESYREL ) 100 MG tablet Take 1 tablet (100 mg total) by mouth at bedtime. 90 tablet 0   triamcinolone  cream (KENALOG ) 0.1 % Apply 1 application topically 2 (two) times daily. 45 g 3   Vitamin D , Ergocalciferol , (DRISDOL) 1.25 MG (50000 UNIT) CAPS capsule Take 50,000 Units by mouth once a week.     Allergies  Allergen Reactions   Penicillins Rash   Latex     I have reviewed patient's Past Medical Hx, Surgical Hx, Family Hx, Social Hx, medications and allergies.   Review of Systems  Respiratory: Negative.    Cardiovascular: Negative.   Gastrointestinal:  Positive for abdominal pain.  Genitourinary:  Positive for pelvic pain.    OBJECTIVE Patient Vitals for the past 24 hrs:  BP Temp Temp src Pulse Resp SpO2 Height Weight  01/11/24 0826 112/79 -- -- 76 -- 100 % -- --  01/11/24 0756 106/78 98 F (36.7 C) Oral 96 18 100 % -- --  01/11/24 0747 -- -- -- -- -- -- 5' 5 (1.651 m) 72 kg    Constitutional: Well-developed, well-nourished female in no acute distress.  Cardiovascular: normal rate & rhythm, no murmur Respiratory: normal rate and effort. Lung sounds clear throughout GI: Abd soft, tender w/ palpation, Pos BS x 4. No guarding or rebound tenderness MS: Extremities nontender, no edema, normal ROM Neurologic: Alert and oriented x 4.     LAB RESULTS Results for orders placed or performed during the hospital encounter of 01/11/24 (from the past 24 hours)  Urinalysis, Routine w reflex microscopic -Urine, Clean Catch     Status: Abnormal   Collection Time: 01/11/24  8:00 AM  Result Value Ref Range   Color, Urine YELLOW YELLOW   APPearance HAZY (A) CLEAR   Specific Gravity, Urine 1.019 1.005 - 1.030   pH 7.0 5.0 - 8.0   Glucose, UA NEGATIVE NEGATIVE mg/dL   Hgb urine dipstick NEGATIVE NEGATIVE   Bilirubin Urine NEGATIVE NEGATIVE   Ketones, ur NEGATIVE NEGATIVE mg/dL   Protein, ur NEGATIVE NEGATIVE mg/dL   Nitrite NEGATIVE NEGATIVE   Leukocytes,Ua NEGATIVE NEGATIVE  Wet prep, genital     Status: None   Collection Time: 01/11/24  9:43 AM   Specimen: PATH Cytology Cervicovaginal Ancillary Only  Result Value Ref Range   Yeast Wet Prep HPF POC NONE SEEN NONE SEEN   Trich, Wet Prep NONE SEEN NONE SEEN   Clue Cells Wet Prep HPF POC NONE SEEN NONE SEEN   WBC, Wet Prep HPF POC <10 <10   Sperm NONE SEEN   CBC     Status: None   Collection Time: 01/11/24 10:18 AM  Result Value Ref Range   WBC 7.7 4.0 - 10.5 K/uL   RBC 4.54 3.87 - 5.11 MIL/uL   Hemoglobin 12.5 12.0 - 15.0 g/dL   HCT 62.7 63.9 - 53.9 %   MCV 81.9 80.0 - 100.0 fL   MCH 27.5 26.0 - 34.0 pg   MCHC 33.6 30.0 - 36.0 g/dL   RDW 87.4 88.4 - 84.4 %   Platelets 283 150 - 400 K/uL   nRBC 0.0 0.0 - 0.2 %    IMAGING US  OB LESS THAN 14 WEEKS WITH OB TRANSVAGINAL Result Date: 01/11/2024 CLINICAL DATA:  Abdominal pain, left lower quadrant pain. Beta HCG and process. LMP 12/05/2023   EXAM: OBSTETRIC <14 WK  US  AND TRANSVAGINAL OB US  TECHNIQUE:   Both transabdominal and transvaginal ultrasound  examinations were performed for complete evaluation of the gestation as well as the maternal uterus, adnexal regions, and pelvic cul-de-sac. Transvaginal technique was performed to assess early pregnancy. COMPARISON:  Ultrasound 10/08/2023 FINDINGS: Intrauterine gestational sac: None Yolk sac:  Not Visualized. Embryo:  Not Visualized. Cardiac Activity: Not Visualized. Maternal uterus/adnexae: Normal ovaries. Within the left adnexa there is a 5.4 x 3.0 x 4.4 cm heterogenous mass suspicious for ectopic pregnancy. Large volume hemorrhage in the pelvis.   IMPRESSION: Heterogenous mass near the left adnexa with large volume hemorrhage in the pelvis. Findings are suspicious for ruptured ectopic pregnancy. Critical Value/emergent results were called by telephone at the time of interpretation on 01/11/2024 at 11:25 am to provider Dr. Ladene, who verbally acknowledged these results.   Electronically Signed   By: Norman Gatlin M.D.   On: 01/11/2024 11:37    MAU COURSE Orders Placed This Encounter  Procedures   Wet prep, genital   US  OB LESS THAN 14 WEEKS WITH OB TRANSVAGINAL   Urinalysis, Routine w reflex microscopic -Urine, Clean Catch   CBC   hCG, quantitative, pregnancy   Type and screen [facility]   Prepare RBC (crossmatch)   Meds ordered this encounter  Medications   acetaminophen  (TYLENOL ) tablet 1,000 mg   ondansetron  (ZOFRAN -ODT) disintegrating tablet 4 mg   lactated ringers  bolus 1,000 mL   fentaNYL  (SUBLIMAZE ) injection 12.5 mcg   fentaNYL  (SUBLIMAZE ) injection 100 mcg    MDM Wet Prep GC/Chlam  CBC hCG ABO/Rh Given Tylenol  and FentaNYL  for pain control Zofran  for nauseated LR 1000 ml   ASSESSMENT/PLAN  1. Ruptured ectopic pregnancy (Primary) 2. Abdominal cramping Patient present w/ severe abdominal pain and spotting. US  show Heterogenous mass near the left adnexa  with large volume hemorrhage in the pelvis. Findings are suspicious for ruptured ectopic pregnancy. Pt has been stable throughout her MAU visit - OBGYN was consulted and accepted the patient - Planing for surgical intervention today with OBGYN  3. [redacted] weeks gestation of pregnancy    Allergies as of 01/11/2024       Reactions   Penicillins Rash   Latex      Med Rec must be completed prior to using this SMARTLINK    Taline Nass B, DO PGY 1 Family Medicine Resident  01/11/2024 11:45 AM

## 2024-01-11 NOTE — Anesthesia Preprocedure Evaluation (Addendum)
 Anesthesia Evaluation  Patient identified by MRN, date of birth, ID band Patient awake    Reviewed: Allergy & Precautions, H&P , NPO status , Patient's Chart, lab work & pertinent test results  History of Anesthesia Complications Negative for: history of anesthetic complications  Airway Mallampati: I  TM Distance: >3 FB Neck ROM: Full    Dental  (+) Teeth Intact, Dental Advisory Given   Pulmonary neg shortness of breath, neg sleep apnea, neg COPD, neg recent URI   breath sounds clear to auscultation       Cardiovascular negative cardio ROS  Rhythm:Regular Rate:Normal     Neuro/Psych negative neurological ROS     GI/Hepatic negative GI ROS, Neg liver ROS,,,  Endo/Other  negative endocrine ROS    Renal/GU negative Renal ROS     Musculoskeletal negative musculoskeletal ROS (+)    Abdominal   Peds  Hematology negative hematology ROS (+) Lab Results      Component                Value               Date                      WBC                      7.7                 01/11/2024                HGB                      12.5                01/11/2024                HCT                      37.2                01/11/2024                MCV                      81.9                01/11/2024                PLT                      283                 01/11/2024              Anesthesia Other Findings   Reproductive/Obstetrics Lab Results      Component                Value               Date                      PREGTESTUR               POSITIVE (A)        01/11/2024                HCGQUANT  1                   10/19/2023            ectopic                              Anesthesia Physical Anesthesia Plan  ASA: 2  Anesthesia Plan: General   Post-op Pain Management: Tylenol  PO (pre-op)* and Ofirmev  IV (intra-op)*   Induction: Intravenous  PONV Risk Score and Plan: 4 or  greater and Ondansetron , Dexamethasone  and Midazolam   Airway Management Planned: Oral ETT  Additional Equipment: None  Intra-op Plan:   Post-operative Plan: Extubation in OR  Informed Consent: I have reviewed the patients History and Physical, chart, labs and discussed the procedure including the risks, benefits and alternatives for the proposed anesthesia with the patient or authorized representative who has indicated his/her understanding and acceptance.     Dental advisory given  Plan Discussed with: CRNA, Anesthesiologist and Surgeon  Anesthesia Plan Comments:          Anesthesia Quick Evaluation

## 2024-01-11 NOTE — MAU Note (Signed)
 Yvonne Christensen is a 28 y.o. at [redacted]w[redacted]d here in MAU reporting: she began having left lower abdominal pain that began yesterday.  Reports the pain is mostly with movement and is sharp.  States pain is making it hard to walk, sit, and lay.  Reports pain is causing nausea, but not vomiting.  States took Tylenol  yesterday at 1700, no relief noted it didn't work.Denies spotting, noted spotting with wiping x1 yesterday.    LMP: 12/05/2023 Onset of complaint: yesterday Pain score: 10 Vitals:   01/11/24 0756  BP: 106/78  Pulse: 96  Resp: 18  Temp: 98 F (36.7 C)  SpO2: 100%     FHT: NA  Lab orders placed from triage: UPT

## 2024-01-12 ENCOUNTER — Encounter (HOSPITAL_COMMUNITY): Payer: Self-pay | Admitting: Obstetrics and Gynecology

## 2024-01-12 LAB — TYPE AND SCREEN
ABO/RH(D): O POS
Antibody Screen: NEGATIVE
Unit division: 0
Unit division: 0

## 2024-01-12 LAB — BPAM RBC
Blood Product Expiration Date: 202509202359
Blood Product Expiration Date: 202509202359
ISSUE DATE / TIME: 202508271940
Unit Type and Rh: 5100
Unit Type and Rh: 5100

## 2024-01-12 LAB — GC/CHLAMYDIA PROBE AMP (~~LOC~~) NOT AT ARMC
Chlamydia: NEGATIVE
Comment: NEGATIVE
Comment: NORMAL
Neisseria Gonorrhea: NEGATIVE

## 2024-01-16 NOTE — Anesthesia Postprocedure Evaluation (Signed)
 Anesthesia Post Note  Patient: Yvonne Christensen  Procedure(s) Performed: LAPAROSCOPY, WITH ECTOPIC PREGNANCY SURGICAL TREATMENT (Abdomen)     Patient location during evaluation: PACU Anesthesia Type: General Level of consciousness: awake and alert Pain management: pain level controlled Vital Signs Assessment: post-procedure vital signs reviewed and stable Respiratory status: spontaneous breathing, nonlabored ventilation, respiratory function stable and patient connected to nasal cannula oxygen Cardiovascular status: blood pressure returned to baseline and stable Postop Assessment: no apparent nausea or vomiting Anesthetic complications: no   No notable events documented.  Last Vitals:  Vitals:   01/11/24 1830 01/11/24 1845  BP: 105/76 100/63  Pulse: 66 66  Resp: 12 13  Temp:  36.9 C  SpO2: 100% 100%    Last Pain:  Vitals:   01/11/24 1831  TempSrc:   PainSc: 8                  Avrianna Smart S

## 2024-01-18 ENCOUNTER — Telehealth: Payer: Self-pay

## 2024-01-18 ENCOUNTER — Ambulatory Visit: Payer: Self-pay | Admitting: Obstetrics and Gynecology

## 2024-01-18 ENCOUNTER — Telehealth: Payer: Self-pay | Admitting: Obstetrics and Gynecology

## 2024-01-18 DIAGNOSIS — O00102 Left tubal pregnancy without intrauterine pregnancy: Secondary | ICD-10-CM

## 2024-01-18 LAB — SURGICAL PATHOLOGY

## 2024-01-18 NOTE — Transitions of Care (Post Inpatient/ED Visit) (Signed)
   01/18/2024  Name: Yvonne Christensen MRN: [id number] DOB: 04-21-1996  Today's TOC FU Call Status: Today's TOC FU Call Status:: Successful TOC FU Call Completed TOC FU Call Complete Date: 01/18/24 Patient's Name and Date of Birth confirmed.  Transition Care Management Follow-up Telephone Call Date of Discharge: 01/11/24 Discharge Facility: [facility]) Type of Discharge: Inpatient Admission Primary Inpatient Discharge Diagnosis:: Ruptured ectopic pregnancy How have you been since you were released from the hospital?: Better Any questions or concerns?: No  Items Reviewed: Did you receive and understand the discharge instructions provided?: Yes Medications obtained,verified, and reconciled?: Yes (Medications Reviewed) Any new allergies since your discharge?: No Dietary orders reviewed?: No Do you have support at home?: Yes  Medications Reviewed Today: Medications Reviewed Today     Reviewed by Gladis Kristeen PARAS, CMA (Certified Medical Assistant) on 01/18/24 at 1731  Med List Status: <None>   Medication Order Taking? Sig Documenting Provider Last Dose Status Informant  acetaminophen  (TYLENOL ) 500 MG tablet [id number] Yes Take 2 tablets (1,000 mg total) by mouth every 6 (six) hours as needed. Erik Kieth BROCKS, MD  Active   escitalopram  (LEXAPRO ) 20 MG tablet [id number] Yes Take 1 tablet (20 mg total) by mouth daily. Curry Leni ONEIDA, MD  Active            Med Note VIRA, FELICIA M   Thu Jan 11, 2024  8:21 AM) Not taking  hydroxychloroquine (PLAQUENIL) 200 MG tablet [id number] Yes Take 200 mg by mouth daily. Georgina Speaks, FNP  Active            Med Note VIRA, FELICIA M   Thu Jan 11, 2024  8:22 AM) Not taking  ibuprofen  (ADVIL ) 600 MG tablet [id number] Yes Take 1 tablet (600 mg total) by mouth every 6 (six) hours as needed. Erik Kieth BROCKS, MD  Active   oxyCODONE  (OXY IR/ROXICODONE ) 5 MG immediate release tablet [id number] Yes Take 1 tablet (5 mg total) by mouth every 4 (four)  hours as needed for severe pain (pain score 7-10) or breakthrough pain. Erik Kieth BROCKS, MD  Active   senna (SENOKOT) 8.6 MG TABS tablet [id number] Yes Take 2 tablets (17.2 mg total) by mouth at bedtime as needed. Erik Kieth BROCKS, MD  Active   traZODone  (DESYREL ) 100 MG tablet [id number] Yes Take 1 tablet (100 mg total) by mouth at bedtime. Curry Leni ONEIDA, MD  Active            Med Note VIRA, FELICIA M   Thu Jan 11, 2024  8:21 AM) Not taking            Home Care and Equipment/Supplies: Were Home Health Services Ordered?: No Any new equipment or medical supplies ordered?: No  Functional Questionnaire: Do you need assistance with bathing/showering or dressing?: No Do you need assistance with meal preparation?: No Do you need assistance with eating?: No Do you have difficulty maintaining continence: No Do you need assistance with getting out of bed/getting out of a chair/moving?: No Do you have difficulty managing or taking your medications?: No  Follow up appointments reviewed: PCP Follow-up appointment confirmed?: No (PT declined.) MD Provider Line [phone] Given: Yes Specialist Hospital Follow-up appointment confirmed?: Yes Date of Specialist follow-up appointment?: 02/09/24 Follow-Up Specialty Provider:: obgyn Do you need transportation to your follow-up appointment?: No Do you understand care options if your condition(s) worsen?: Yes-patient verbalized understanding    SIGNATURE Kristeen Gladis, CMA

## 2024-01-18 NOTE — Telephone Encounter (Signed)
 Called patient to review path - entire tube submitted without evidence of intraluminal pregnancy tissue.   Discussed that this may reflect pregnancy tissue that was present within the blood and clot seen surrounding the disrupted fimbriated end of the left fallopian tube - this tissue would have been suctioned but not sent for path review.   She will come to the [place] office tomorrow (9/4) AM between 8-11am to obtain hcg to confirm it is dropping appropriately. We reviewed that IF this was unexpectedly an IUP, nothing we did during surgery would have disrupted that pregnancy (no uterine manipulator was placed).   Kieth Carolin, MD Obstetrician & Gynecologist, [facility] for [employer], [facility]

## 2024-01-19 ENCOUNTER — Other Ambulatory Visit: Payer: Self-pay

## 2024-01-19 ENCOUNTER — Other Ambulatory Visit

## 2024-01-19 DIAGNOSIS — O00102 Left tubal pregnancy without intrauterine pregnancy: Secondary | ICD-10-CM

## 2024-01-20 ENCOUNTER — Ambulatory Visit: Payer: Self-pay | Admitting: Obstetrics and Gynecology

## 2024-01-20 LAB — BETA HCG QUANT (REF LAB): hCG Quant: 34 m[IU]/mL

## 2024-02-09 ENCOUNTER — Ambulatory Visit (INDEPENDENT_AMBULATORY_CARE_PROVIDER_SITE_OTHER): Admitting: Obstetrics and Gynecology

## 2024-02-09 ENCOUNTER — Encounter: Payer: Self-pay | Admitting: Obstetrics and Gynecology

## 2024-02-09 ENCOUNTER — Other Ambulatory Visit (HOSPITAL_COMMUNITY)
Admission: RE | Admit: 2024-02-09 | Discharge: 2024-02-09 | Disposition: A | Discharge status: Home / Self Care | Source: Ambulatory Visit | Attending: Obstetrics and Gynecology | Admitting: Obstetrics and Gynecology

## 2024-02-09 VITALS — BP 101/71 | HR 64 | Ht 64.0 in | Wt 162.0 lb

## 2024-02-09 DIAGNOSIS — Z124 Encounter for screening for malignant neoplasm of cervix: Secondary | ICD-10-CM | POA: Diagnosis present

## 2024-02-09 DIAGNOSIS — Z09 Encounter for follow-up examination after completed treatment for conditions other than malignant neoplasm: Secondary | ICD-10-CM

## 2024-02-09 NOTE — Progress Notes (Signed)
   POST OPERATIVE GYNECOLOGY VISIT  Subjective:  Yvonne Christensen is a 28 y.o. [phone] 4wk s/p lsc L salpingectomy for ruptured ectopic pregnancy  Occ discomfort on the left side. VB x 4 days shortly after surgery. Otherwise doing well, tolerating regular diet, ambulating, voiding without issue. Unsure about future childbearing  Path without POCs, however this is not c/w intra op findings with large clot present at the end of the fallopian tube. bhCG dropped from 1295 > 34 within 1 week after procedure when path did not report POCs. This is consistent with removal of pregnancy. Unfortunately, intra-op images were not uploaded to patient's chart.   Objective:   Vitals:   02/09/24 0816  BP: 101/71  Pulse: 64  Weight: 162 lb (73.5 kg)  Height: 5' 4 (1.626 m)   General:  Alert, oriented and cooperative. Patient is in no acute distress.  Skin: Skin is warm and dry. No rash noted.   Cardiovascular: Normal heart rate noted  Respiratory: Normal respiratory effort, no problems with respiration noted  Abdomen: Soft, non-tender, non-distended. Incisions well healed, small amount of residual surgical glue that can be washed off     Assessment and Plan:  Yvonne Christensen is a 28 y.o. here for post op visit  Recovering appropriately No further restrictions, may resume all normal activity Reviewed intra op findings esp re: adhesive disease and need for caution with future surgeries Contraception - declines, unsure about future childbearing. Discussed PNV Pap due, collected.  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

## 2024-02-09 NOTE — Progress Notes (Signed)
 Denies concerns. Had few day bleeding. Not sure if menses or not. Left side tenderness at times. Comes and goes. Continues to abstain from intercourse.

## 2024-02-13 LAB — CYTOLOGY - PAP
Adequacy: ABSENT
Comment: NEGATIVE
Diagnosis: NEGATIVE
High risk HPV: NEGATIVE

## 2024-02-14 ENCOUNTER — Ambulatory Visit: Payer: Self-pay | Admitting: Obstetrics and Gynecology

## 2024-05-27 ENCOUNTER — Other Ambulatory Visit: Payer: Self-pay | Admitting: Nurse Practitioner

## 2024-05-27 DIAGNOSIS — Z1231 Encounter for screening mammogram for malignant neoplasm of breast: Secondary | ICD-10-CM

## 2024-06-06 ENCOUNTER — Ambulatory Visit
Admission: RE | Admit: 2024-06-06 | Discharge: 2024-06-06 | Disposition: A | Discharge status: Home / Self Care | Source: Ambulatory Visit | Attending: Nurse Practitioner | Admitting: Nurse Practitioner

## 2024-06-06 DIAGNOSIS — Z1231 Encounter for screening mammogram for malignant neoplasm of breast: Secondary | ICD-10-CM

## 2024-11-13 ENCOUNTER — Encounter: Payer: Self-pay | Admitting: Nurse Practitioner
